# Patient Record
Sex: Female | Born: 1950 | Race: White | Hispanic: No | Marital: Married | Smoking: Former smoker
Health system: Southern US, Community
[De-identification: ages and names within clinical notes are randomized; demographics above are authoritative.]

## PROBLEM LIST (undated history)

## (undated) DIAGNOSIS — Z9889 Other specified postprocedural states: Secondary | ICD-10-CM

## (undated) DIAGNOSIS — K922 Gastrointestinal hemorrhage, unspecified: Secondary | ICD-10-CM

## (undated) DIAGNOSIS — R112 Nausea with vomiting, unspecified: Secondary | ICD-10-CM

## (undated) DIAGNOSIS — I499 Cardiac arrhythmia, unspecified: Secondary | ICD-10-CM

## (undated) DIAGNOSIS — I493 Ventricular premature depolarization: Secondary | ICD-10-CM

## (undated) DIAGNOSIS — I421 Obstructive hypertrophic cardiomyopathy: Secondary | ICD-10-CM

## (undated) DIAGNOSIS — E669 Obesity, unspecified: Secondary | ICD-10-CM

## (undated) DIAGNOSIS — M1990 Unspecified osteoarthritis, unspecified site: Secondary | ICD-10-CM

## (undated) DIAGNOSIS — I10 Essential (primary) hypertension: Secondary | ICD-10-CM

## (undated) DIAGNOSIS — T8859XA Other complications of anesthesia, initial encounter: Secondary | ICD-10-CM

## (undated) DIAGNOSIS — K219 Gastro-esophageal reflux disease without esophagitis: Secondary | ICD-10-CM

## (undated) DIAGNOSIS — M47816 Spondylosis without myelopathy or radiculopathy, lumbar region: Secondary | ICD-10-CM

## (undated) DIAGNOSIS — Q278 Other specified congenital malformations of peripheral vascular system: Secondary | ICD-10-CM

## (undated) HISTORY — DX: Unspecified osteoarthritis, unspecified site: M19.90

## (undated) HISTORY — DX: Cardiac arrhythmia, unspecified: I49.9

## (undated) HISTORY — DX: Obstructive hypertrophic cardiomyopathy: I42.1

## (undated) HISTORY — PX: CERVICAL DISCECTOMY: SHX98

## (undated) HISTORY — PX: THORACIC FUSION: SHX1062

## (undated) HISTORY — PX: BACK SURGERY: SHX140

## (undated) HISTORY — PX: CHOLECYSTECTOMY: SHX55

## (undated) HISTORY — DX: Obesity, unspecified: E66.9

## (undated) HISTORY — DX: Ventricular premature depolarization: I49.3

## (undated) HISTORY — DX: Essential (primary) hypertension: I10

## (undated) HISTORY — PX: CERVICAL FUSION: SHX112

## (undated) HISTORY — DX: Spondylosis without myelopathy or radiculopathy, lumbar region: M47.816

## (undated) HISTORY — PX: ABDOMINAL HYSTERECTOMY: SHX81

## (undated) HISTORY — PX: MENISCECTOMY: SHX123

## (undated) HISTORY — PX: LUMBAR DISC SURGERY: SHX700

## (undated) HISTORY — PX: THORACIC DISCECTOMY: SHX100

---

## 2017-05-15 ENCOUNTER — Other Ambulatory Visit: Payer: Self-pay | Admitting: Internal Medicine

## 2017-05-15 ENCOUNTER — Encounter: Payer: Self-pay | Admitting: Gastroenterology

## 2017-05-15 DIAGNOSIS — Z1231 Encounter for screening mammogram for malignant neoplasm of breast: Secondary | ICD-10-CM

## 2017-05-24 ENCOUNTER — Ambulatory Visit
Admission: RE | Admit: 2017-05-24 | Discharge: 2017-05-24 | Disposition: A | Discharge status: Home / Self Care | Payer: Medicare Other | Source: Ambulatory Visit | Attending: Internal Medicine | Admitting: Internal Medicine

## 2017-05-24 DIAGNOSIS — Z1231 Encounter for screening mammogram for malignant neoplasm of breast: Secondary | ICD-10-CM

## 2017-07-04 ENCOUNTER — Ambulatory Visit: Payer: Self-pay | Admitting: Gastroenterology

## 2017-08-27 ENCOUNTER — Ambulatory Visit: Payer: Medicare Other | Admitting: Gastroenterology

## 2017-12-31 ENCOUNTER — Encounter: Payer: Self-pay | Admitting: Emergency Medicine

## 2017-12-31 ENCOUNTER — Ambulatory Visit (INDEPENDENT_AMBULATORY_CARE_PROVIDER_SITE_OTHER)
Admission: RE | Admit: 2017-12-31 | Discharge: 2017-12-31 | Disposition: A | Discharge status: Home / Self Care | Payer: Medicare Other | Source: Ambulatory Visit | Attending: Emergency Medicine | Admitting: Emergency Medicine

## 2017-12-31 ENCOUNTER — Ambulatory Visit (INDEPENDENT_AMBULATORY_CARE_PROVIDER_SITE_OTHER): Payer: Medicare Other | Admitting: Emergency Medicine

## 2017-12-31 VITALS — BP 114/76 | HR 86 | Ht 66.0 in | Wt 249.0 lb

## 2017-12-31 DIAGNOSIS — R490 Dysphonia: Secondary | ICD-10-CM

## 2017-12-31 DIAGNOSIS — R05 Cough: Secondary | ICD-10-CM | POA: Diagnosis not present

## 2017-12-31 DIAGNOSIS — R059 Cough, unspecified: Secondary | ICD-10-CM

## 2017-12-31 MED ORDER — LORATADINE 10 MG PO TABS: 10.0000 mg | ORAL_TABLET | Freq: Every day | ORAL | 5 refills | Status: DC

## 2017-12-31 MED ORDER — FLUTICASONE PROPIONATE 50 MCG/ACT NA SUSP: 2.0000 | Freq: Every day | NASAL | 5 refills | Status: DC

## 2017-12-31 NOTE — Assessment & Plan Note (Signed)
She hears noise when she breathes especially with expiration, it is a crackling or wheeze she feels localized to her throat.  She feels a globus sensation.  This sounds like upper airway irritation.  She denies any GERD but she does have chronic rhinitis.  We will try to treat this aggressively.  We talked about avoiding throat clearing and other things away irritate her throat.  We will perform a chest x-ray today and start therapy as below.  If her symptoms continue then we will discuss possible visualization of her posterior pharynx and her airways.

## 2017-12-31 NOTE — Patient Instructions (Signed)
We will perform a CXR today.  Please start taking fluticasone nasal spray, 2 sprays each nostril once a day.  Try to avoid taking this medication right before laying down for bed because we want to avoid having it drained down to your throat. Please start loratadine 10 mg daily until our next visit. Try your best to avoid throat clearing.  Try using a sugar-free candy to keep in your mouth.  When you have the urge to clear your throat just swallow instead. Avoid methylated cough drops. Follow with Dr. Delton CoombesByrum in about 1 month.  At that time we will assess your symptoms and decide whether we need to expand the workup further.

## 2017-12-31 NOTE — Progress Notes (Signed)
Subjective:    Patient ID: Jennifer Baxter, female    DOB: [birth year], 67 y.o.   MRN: [id number]  HPI 67 year old former smoker (10 pk-yrs) with a history of hypertension, ?? hypertrophic obstructive cardiomyopathy (soft call on TTE), obesity, PVCs, prior GI bleeding, spinal surgeries.  She presents today for evaluation of some expiratory noise, ? "Crackling", that she started to notice a few weeks ago. Seems to localize to her upper throat. She has nasal drainage and some congestion. Feels the need to clear throat, globus sensation. Possibly some dyspnea. Has gained about 100 lbs since a back sgy 10+ yrs ago.    Review of Systems  Constitutional: Negative for fever and unexpected weight change.  HENT: Negative for congestion, dental problem, ear pain, nosebleeds, postnasal drip, rhinorrhea, sinus pressure, sneezing, sore throat and trouble swallowing.   Eyes: Negative for redness and itching.  Respiratory: Positive for cough, shortness of breath and wheezing. Negative for chest tightness.   Cardiovascular: Negative for palpitations and leg swelling.  Gastrointestinal: Negative for nausea and vomiting.  Genitourinary: Negative for dysuria.  Musculoskeletal: Negative for joint swelling.  Skin: Negative for rash.  Neurological: Negative for headaches.  Hematological: Does not bruise/bleed easily.  Psychiatric/Behavioral: Negative for dysphoric mood. The patient is not nervous/anxious.    Past Medical History:  Diagnosis Date  . Arrhythmia   . Arthritis   . Degenerative joint disease (DJD) of lumbar spine   . DJD (degenerative joint disease)   . HOCM (hypertrophic obstructive cardiomyopathy) (HCC)   . Hypertension   . Obesity   . PVC's (premature ventricular contractions)      Family History  Problem Relation Age of Onset  . Arthritis Mother   . Diabetes Mellitus I Mother   . Hypertension Mother   . Arthritis Father   . Hypertension Father   . Arthritis Sister   . Hypertension  Sister   . Stroke Sister   . Alcoholism Brother   . Colon cancer Brother   . Hypertension Brother   . Diabetes Mellitus I Maternal Grandmother   . Heart disease Paternal Grandmother   . Hypertension Paternal Grandfather   . Stroke Paternal Grandfather   . Breast cancer Neg Hx      Social History   Socioeconomic History  . Marital status: Married    Spouse name: Not on file  . Number of children: Not on file  . Years of education: Not on file  . Highest education level: Not on file  Occupational History  . Not on file  Social Needs  . Financial resource strain: Not on file  . Food insecurity:    Worry: Not on file    Inability: Not on file  . Transportation needs:    Medical: Not on file    Non-medical: Not on file  Tobacco Use  . Smoking status: Never Smoker  . Smokeless tobacco: Never Used  Substance and Sexual Activity  . Alcohol use: No  . Drug use: No  . Sexual activity: Not on file  Lifestyle  . Physical activity:    Days per week: Not on file    Minutes per session: Not on file  . Stress: Not on file  Relationships  . Social connections:    Talks on phone: Not on file    Gets together: Not on file    Attends religious service: Not on file    Active member of club or organization: Not on file    Attends meetings of clubs  or organizations: Not on file    Relationship status: Not on file  . Intimate partner violence:    Fear of current or ex partner: Not on file    Emotionally abused: Not on file    Physically abused: Not on file    Forced sexual activity: Not on file  Other Topics Concern  . Not on file  Social History Narrative  . 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  Has lived in   Allergies  Allergen Reactions  . Codeine Hives, Itching and Rash  . Morphine Hives, Itching and Other (See Comments)    Questionable Arm swelled      Outpatient Medications Prior to Visit    Medication Sig Dispense Refill  . amLODipine (NORVASC) 5 MG tablet Take 1 tablet by mouth daily.    Marland Kitchen. aspirin 81 MG tablet Take 1 tablet by mouth daily.    . metoprolol tartrate (LOPRESSOR) 100 MG tablet Take 1 tablet by mouth 2 (two) times daily.     No facility-administered medications prior to visit.         Objective:   Physical Exam Vitals:   12/31/17 1518  BP: 114/76  Pulse: 86  SpO2: 97%  Weight: 249 lb (112.9 kg)  Height: 5\' 6"  (1.676 m)   Gen: Pleasant, well-nourished, in no distress,  normal affect  ENT: No lesions,  mouth clear,  oropharynx clear, no postnasal drip  Neck: No JVD, some intermittent soft stridor  Lungs: No use of accessory muscles, clear without rales or rhonchi  Cardiovascular: RRR, heart sounds normal, no murmur or gallops, no peripheral edema  Musculoskeletal: No deformities, no cyanosis or clubbing  Neuro: alert, non focal  Skin: Warm, no lesions or rash     Assessment & Plan:  Hoarseness She hears noise when she breathes especially with expiration, it is a crackling or wheeze she feels localized to her throat.  She feels a globus sensation.  This sounds like upper airway irritation.  She denies any GERD but she does have chronic rhinitis.  We will try to treat this aggressively.  We talked about avoiding throat clearing and other things away irritate her throat.  We will perform a chest x-ray today and start therapy as below.  If her symptoms continue then we will discuss possible visualization of her posterior pharynx and her airways.  Levy Pupaobert Demtrius Rounds, MD, PhD 12/31/2017, 3:49 PM [facility] [phone] or if no answer [phone]

## 2018-02-03 ENCOUNTER — Ambulatory Visit: Payer: Medicare Other | Admitting: Emergency Medicine

## 2018-06-30 ENCOUNTER — Other Ambulatory Visit: Payer: Self-pay | Admitting: Orthopedic Surgery

## 2018-06-30 DIAGNOSIS — M542 Cervicalgia: Secondary | ICD-10-CM

## 2018-07-08 ENCOUNTER — Ambulatory Visit
Admission: RE | Admit: 2018-07-08 | Discharge: 2018-07-08 | Disposition: A | Discharge status: Home / Self Care | Payer: Medicare Other | Source: Ambulatory Visit | Attending: Orthopedic Surgery | Admitting: Orthopedic Surgery

## 2018-07-08 DIAGNOSIS — M542 Cervicalgia: Secondary | ICD-10-CM

## 2018-07-14 ENCOUNTER — Other Ambulatory Visit: Payer: Self-pay | Admitting: Otolaryngology

## 2018-07-14 DIAGNOSIS — IMO0001 Reserved for inherently not codable concepts without codable children: Secondary | ICD-10-CM

## 2018-07-14 DIAGNOSIS — H9041 Sensorineural hearing loss, unilateral, right ear, with unrestricted hearing on the contralateral side: Secondary | ICD-10-CM

## 2018-07-20 ENCOUNTER — Ambulatory Visit
Admission: RE | Admit: 2018-07-20 | Discharge: 2018-07-20 | Disposition: A | Discharge status: Home / Self Care | Payer: Medicare Other | Source: Ambulatory Visit | Attending: Otolaryngology | Admitting: Otolaryngology

## 2018-07-20 DIAGNOSIS — IMO0001 Reserved for inherently not codable concepts without codable children: Secondary | ICD-10-CM

## 2018-07-20 DIAGNOSIS — H9041 Sensorineural hearing loss, unilateral, right ear, with unrestricted hearing on the contralateral side: Secondary | ICD-10-CM

## 2018-07-20 MED ORDER — GADOBENATE DIMEGLUMINE 529 MG/ML IV SOLN
15.0000 mL | Freq: Once | INTRAVENOUS | Status: AC | PRN
  Administered 2018-07-20: 15 mL via INTRAVENOUS

## 2019-05-24 ENCOUNTER — Other Ambulatory Visit: Payer: Self-pay

## 2019-05-24 ENCOUNTER — Emergency Department (HOSPITAL_COMMUNITY): Payer: Medicare Other

## 2019-05-24 ENCOUNTER — Emergency Department (HOSPITAL_COMMUNITY)
Admission: EM | Admit: 2019-05-24 | Discharge: 2019-05-24 | Disposition: A | Discharge status: Home / Self Care | Payer: Medicare Other | Attending: Emergency Medicine | Admitting: Emergency Medicine

## 2019-05-24 ENCOUNTER — Encounter (HOSPITAL_COMMUNITY): Payer: Self-pay | Admitting: Emergency Medicine

## 2019-05-24 DIAGNOSIS — Z79899 Other long term (current) drug therapy: Secondary | ICD-10-CM | POA: Diagnosis not present

## 2019-05-24 DIAGNOSIS — I493 Ventricular premature depolarization: Secondary | ICD-10-CM

## 2019-05-24 DIAGNOSIS — R000 Tachycardia, unspecified: Secondary | ICD-10-CM | POA: Diagnosis present

## 2019-05-24 DIAGNOSIS — E876 Hypokalemia: Secondary | ICD-10-CM | POA: Diagnosis not present

## 2019-05-24 DIAGNOSIS — R002 Palpitations: Secondary | ICD-10-CM

## 2019-05-24 DIAGNOSIS — I10 Essential (primary) hypertension: Secondary | ICD-10-CM | POA: Diagnosis not present

## 2019-05-24 DIAGNOSIS — Z7982 Long term (current) use of aspirin: Secondary | ICD-10-CM | POA: Diagnosis not present

## 2019-05-24 LAB — BASIC METABOLIC PANEL
Anion gap: 17 — ABNORMAL HIGH (ref 5–15)
BUN: 20 mg/dL (ref 8–23)
CO2: 20 mmol/L — ABNORMAL LOW (ref 22–32)
Calcium: 9.8 mg/dL (ref 8.9–10.3)
Chloride: 101 mmol/L (ref 98–111)
Creatinine, Ser: 1.1 mg/dL — ABNORMAL HIGH (ref 0.44–1.00)
GFR calc Af Amer: 60 mL/min — ABNORMAL LOW (ref >60)
GFR calc non Af Amer: 52 mL/min — ABNORMAL LOW (ref >60)
Glucose, Bld: 118 mg/dL — ABNORMAL HIGH (ref 70–99)
Potassium: 2.8 mmol/L — ABNORMAL LOW (ref 3.5–5.1)
Sodium: 138 mmol/L (ref 135–145)

## 2019-05-24 LAB — TROPONIN I (HIGH SENSITIVITY)
Troponin I (High Sensitivity): 5 ng/L (ref <18)
Troponin I (High Sensitivity): 5 ng/L (ref <18)

## 2019-05-24 LAB — MAGNESIUM: Magnesium: 2.2 mg/dL (ref 1.7–2.4)

## 2019-05-24 LAB — CBC
HCT: 41.5 % (ref 36.0–46.0)
Hemoglobin: 14.5 g/dL (ref 12.0–15.0)
MCH: 31.5 pg (ref 26.0–34.0)
MCHC: 34.9 g/dL (ref 30.0–36.0)
MCV: 90.2 fL (ref 80.0–100.0)
Platelets: 263 10*3/uL (ref 150–400)
RBC: 4.6 MIL/uL (ref 3.87–5.11)
RDW: 11.9 % (ref 11.5–15.5)
WBC: 11.7 10*3/uL — ABNORMAL HIGH (ref 4.0–10.5)
nRBC: 0 % (ref 0.0–0.2)

## 2019-05-24 MED ORDER — ACETAMINOPHEN 500 MG PO TABS
1000.0000 mg | ORAL_TABLET | Freq: Once | ORAL | Status: AC
  Administered 2019-05-24: 1000 mg via ORAL
  Filled 2019-05-24: qty 2

## 2019-05-24 MED ORDER — POTASSIUM CHLORIDE CRYS ER 20 MEQ PO TBCR
40.0000 meq | EXTENDED_RELEASE_TABLET | Freq: Once | ORAL | Status: AC
  Administered 2019-05-24: 40 meq via ORAL
  Filled 2019-05-24: qty 2

## 2019-05-24 MED ORDER — SODIUM CHLORIDE 0.9% FLUSH: 3.0000 mL | Freq: Once | INTRAVENOUS | Status: DC

## 2019-05-24 MED ORDER — POTASSIUM CHLORIDE 10 MEQ/100ML IV SOLN
10.0000 meq | Freq: Once | INTRAVENOUS | Status: AC
  Administered 2019-05-24: 09:00:00 10 meq via INTRAVENOUS
  Filled 2019-05-24: qty 100

## 2019-05-24 MED ORDER — MAGNESIUM SULFATE 2 GM/50ML IV SOLN
2.0000 g | Freq: Once | INTRAVENOUS | Status: AC
  Administered 2019-05-24: 2 g via INTRAVENOUS
  Filled 2019-05-24: qty 50

## 2019-05-24 MED ORDER — SODIUM CHLORIDE 0.9 % IV BOLUS
1000.0000 mL | Freq: Once | INTRAVENOUS | Status: AC
  Administered 2019-05-24: 1000 mL via INTRAVENOUS

## 2019-05-24 NOTE — ED Triage Notes (Signed)
Patient from home with tachycardia with multiple PVC's.  Her HR is around 90-100's.  Any exertion her HR shoots up to 130's.  She has had a changed her meds from Metoprolol to Verapamil.  She is dizzy with standing and walking.

## 2019-05-24 NOTE — ED Notes (Signed)
Attempted to ambulate pt. Was only able to get her to standing position. Pt denied dizziness but said she felt "like something wasn't right" Pt swayed a bit upon standing. HR went from 80 bpm sitting, to 125 bpm standing.

## 2019-05-24 NOTE — ED Provider Notes (Signed)
[facility] Provider Note   CSN: [id number] Arrival date & time: 05/24/19  0411     History   Chief Complaint Chief Complaint  Patient presents with   Tachycardia    HPI Rufina Falcoileen Bey is a 68 y.o. female.     HPI  This is a 68 year old female with a history of hypertension, frequent PVCs, hypertrophic obstructive cardiomyopathy who presents with tachycardia.  Patient reports she has a longstanding history of frequent and symptomatic PVCs.  She was on metoprolol for a long period of time but then became symptomatic again.  She saw her cardiologist in [place] on Friday.  She was taken off amlodipine and metoprolol and was placed on verapamil extended relief.  She states that last night she had episodes of profound tachycardia and frequent PVCs.  Worsened with certain position changes.  She began to feel nauseated and some shortness of breath.  No chest pain.  No recent illnesses or fevers.  Currently she reports that she just feels like she is throwing PVCs frequently.  Denies any recent other changes in medications or diet.  Past Medical History:  Diagnosis Date   Arrhythmia    Arthritis    Degenerative joint disease (DJD) of lumbar spine    DJD (degenerative joint disease)    HOCM (hypertrophic obstructive cardiomyopathy) (HCC)    Hypertension    Obesity    PVC's (premature ventricular contractions)     Patient Active Problem List   Diagnosis Date Noted   Hoarseness 12/31/2017    Past Surgical History:  Procedure Laterality Date   ABDOMINAL HYSTERECTOMY     CERVICAL DISCECTOMY     CERVICAL FUSION     CHOLECYSTECTOMY     LUMBAR DISC SURGERY     MENISCECTOMY     THORACIC DISCECTOMY       OB History   No obstetric history on file.      Home Medications    Prior to Admission medications   Medication Sig Start Date End Date Taking? Authorizing Provider  amLODipine (NORVASC) 5 MG tablet Take 1 tablet by  mouth daily. 11/07/17   [provider]  aspirin 81 MG tablet Take 1 tablet by mouth daily.    [provider]  fluticasone (FLONASE) 50 MCG/ACT nasal spray Place 2 sprays into both nostrils daily. 12/31/17   Leslye PeerByrum, Robert S, MD  loratadine (CLARITIN) 10 MG tablet Take 1 tablet (10 mg total) by mouth daily. 12/31/17   Leslye PeerByrum, Robert S, MD  metoprolol tartrate (LOPRESSOR) 100 MG tablet Take 1 tablet by mouth 2 (two) times daily. 04/26/16   [provider]    Family History Family History  Problem Relation Age of Onset   Arthritis Mother    Diabetes Mellitus I Mother    Hypertension Mother    Arthritis Father    Hypertension Father    Arthritis Sister    Hypertension Sister    Stroke Sister    Alcoholism Brother    Colon cancer Brother    Hypertension Brother    Diabetes Mellitus I Maternal Grandmother    Heart disease Paternal Grandmother    Hypertension Paternal Grandfather    Stroke Paternal Grandfather    Breast cancer Neg Hx     Social History Social History   Tobacco Use   Smoking status: Never Smoker   Smokeless tobacco: Never Used  Substance Use Topics   Alcohol use: No   Drug use: No     Allergies  Codeine and Morphine   Review of Systems Review of Systems  Constitutional: Negative for fever.  Respiratory: Negative for shortness of breath.   Cardiovascular: Positive for palpitations. Negative for chest pain and leg swelling.  Gastrointestinal: Positive for nausea. Negative for abdominal pain and vomiting.  Genitourinary: Negative for dysuria.  All other systems reviewed and are negative.    Physical Exam Updated Vital Signs BP (!) 131/96    Pulse 96    Temp 98.6 F (37 C) (Oral)    Resp (!) 22    SpO2 100%   Physical Exam Vitals signs and nursing note reviewed.  Constitutional:      Appearance: She is well-developed.  HENT:     Head: Normocephalic and atraumatic.  Eyes:     Pupils: Pupils are equal,  round, and reactive to light.  Neck:     Musculoskeletal: Neck supple.  Cardiovascular:     Rate and Rhythm: Regular rhythm. Tachycardia present.     Pulses: Normal pulses.     Heart sounds: Normal heart sounds.     Comments: Frequent PVCs Pulmonary:     Effort: Pulmonary effort is normal. No respiratory distress.     Breath sounds: No wheezing.  Abdominal:     General: Bowel sounds are normal.     Palpations: Abdomen is soft.     Tenderness: There is no abdominal tenderness.  Musculoskeletal:     Right lower leg: No edema.     Left lower leg: No edema.  Skin:    General: Skin is warm and dry.  Neurological:     Mental Status: She is alert and oriented to person, place, and time.  Psychiatric:        Mood and Affect: Mood normal.     Comments: Anxious appearing but nontoxic      ED Treatments / Results  Labs (all labs ordered are listed, but only abnormal results are displayed) Labs Reviewed  BASIC METABOLIC PANEL - Abnormal; Notable for the following components:      Result Value   Potassium 2.8 (*)    CO2 20 (*)    Glucose, Bld 118 (*)    Creatinine, Ser 1.10 (*)    GFR calc non Af Amer 52 (*)    GFR calc Af Amer 60 (*)    Anion gap 17 (*)    All other components within normal limits  CBC - Abnormal; Notable for the following components:   WBC 11.7 (*)    All other components within normal limits  MAGNESIUM  TROPONIN I (HIGH SENSITIVITY)  TROPONIN I (HIGH SENSITIVITY)    EKG EKG Interpretation  Date/Time:  Sunday May 24 2019 04:21:39 EDT Ventricular Rate:  114 PR Interval:  164 QRS Duration: 86 QT Interval:  344 QTC Calculation: 474 R Axis:   18 Text Interpretation:  Sinus tachycardia with Fusion complexes Nonspecific ST abnormality Abnormal ECG Confirmed by Ross Marcus ([id number]) on 05/24/2019 6:02:50 AM   Radiology Dg Chest 2 View  Result Date: 05/24/2019 CLINICAL DATA:  Tachycardia and PVCs. Chest pain. EXAM: CHEST - 2 VIEW COMPARISON:   12/31/2017 FINDINGS: Midline trachea. Normal heart size and mediastinal contours. No pleural effusion or pneumothorax. Clear lungs. Cholecystectomy. IMPRESSION: No acute cardiopulmonary disease. Electronically Signed   By: Jeronimo Greaves M.D.   On: 05/24/2019 05:04    Procedures Procedures (including critical care time)  Medications Ordered in ED Medications  sodium chloride flush (NS) 0.9 % injection 3 mL (has no administration in  time range)  sodium chloride 0.9 % bolus 1,000 mL (has no administration in time range)  potassium chloride 10 mEq in 100 mL IVPB (has no administration in time range)  magnesium sulfate IVPB 2 g 50 mL (has no administration in time range)  potassium chloride SA (K-DUR) CR tablet 40 mEq (40 mEq Oral Given 05/24/19 6606)     Initial Impression / Assessment and Plan / ED Course  I have reviewed the triage vital signs and the nursing notes.  Pertinent labs & imaging results that were available during my care of the patient were reviewed by me and considered in my medical decision making (see chart for details).        Patient presents with palpitations and PVC.  Recent medication changes from metoprolol and amlodipine to verapamil.  She is nontoxic-appearing.  Initially tachycardic.  EKG shows tachycardia with frequent PVCs.  Work-up initiated.  She is noted to be hypokalemic to 2.8.  Magnesium added to work-up.  Presumed hypo-mag and she will be supplemented with magnesium as well.  Troponin is negative.  No ischemic changes on EKG.  She reports some dizziness.  Patient was given fluids as well for presumed mild dehydration which is likely contributing.  Will sign out to oncoming provider.  Final Clinical Impressions(s) / ED Diagnoses   Final diagnoses:  Palpitations  PVC (premature ventricular contraction)  Hypokalemia    ED Discharge Orders    None       Loriel Diehl, Barbette Hair, MD 05/24/19 [phone]

## 2019-05-24 NOTE — Discharge Instructions (Signed)
Go back to taking your metoprolol as previously prescribed.  The cardiologist is trying to get you an appointment sometime next week.  If you have not heard anything by Monday or Tuesday he can give the office a call and ask.  Please return to the emergency department for worsening symptoms or concern for feeling like you may pass out or if you pass out.

## 2019-05-24 NOTE — ED Provider Notes (Signed)
68 yo F with a cc of symptomatic palpitations.  Going on for the past few days.  She was seen in her cardiologist office on Friday and had a medication change.  Since then she feels that her symptoms are gotten worse.  She was having some palpitations and tachycardia on arrival.  I received the patient in signout from Dr. Dina Rich.  She was found to have hypokalemia and the plan was to give a bolus of IV fluids potassium and magnesium and then reassess.  Patient unfortunately was a difficult IV stick and so I placed an ultrasound-guided IV.  Procedure note: Ultrasound Guided Peripheral IV Ultrasound guided peripheral 1.88 inch angiocath IV placement performed by me. Indications: Nursing unable to place IV. Details: The antecubital fossa and upper arm were evaluated with a multifrequency linear probe. Patent brachial veins were noted. 1 attempt was made to cannulate a vein under realtime US guidance with successful cannulation of the vein and catheter placement. There is return of non-pulsatile dark red blood. The patient tolerated the procedure well without complications. Images archived electronically.  CPT codes: [phone] and [phone]   The patient's PVCs have resolved with fluids potassium and magnesium.  She still feels very weak when she gets up and feels that she is having palpitations when she does very minimal exertion.  She has been on 100 mg twice daily of metoprolol.  Was taken off abruptly on Friday and switch to 5 mg of verapamil.  I will discuss the case with her cardiologist here.  I discussed case with Dr. Rayann Heman, cardiology.  He recommended switching her back to her metoprolol.  He will try and schedule her follow-up with the electrophysiology clinic this week.   Deno Etienne, DO 05/24/19 1217

## 2019-06-03 ENCOUNTER — Other Ambulatory Visit: Payer: Self-pay

## 2019-06-03 ENCOUNTER — Encounter: Payer: Self-pay | Admitting: Cardiology

## 2019-06-03 ENCOUNTER — Ambulatory Visit (INDEPENDENT_AMBULATORY_CARE_PROVIDER_SITE_OTHER): Payer: Medicare Other | Admitting: Cardiology

## 2019-06-03 VITALS — BP 118/82 | HR 84 | Ht 66.0 in | Wt 240.4 lb

## 2019-06-03 DIAGNOSIS — I471 Supraventricular tachycardia: Secondary | ICD-10-CM | POA: Diagnosis not present

## 2019-06-03 DIAGNOSIS — I493 Ventricular premature depolarization: Secondary | ICD-10-CM | POA: Diagnosis not present

## 2019-06-03 MED ORDER — DILTIAZEM HCL ER COATED BEADS 120 MG PO CP24: 120.0000 mg | ORAL_CAPSULE | Freq: Every day | ORAL | 3 refills | Status: DC

## 2019-06-03 NOTE — Progress Notes (Signed)
Electrophysiology Office Note   Date:  06/03/2019   ID:  Jennifer Baxter, DOB 07-20-1951, MRN [id number]  PCP:  Haywood Pao, MD  Cardiologist:   Primary Electrophysiologist:  Devonna Oboyle Meredith Leeds, MD    Chief Complaint: palpitations   History of Present Illness: Jennifer Baxter is a 68 y.o. female who is being seen today for the evaluation of PVCs at the request of Tisovec, Fransico Him, MD. Presenting today for electrophysiology evaluation.  She has a history of hypertension and PVCs who presents for evaluation of PVCs.  She has previously been on metoprolol.  She saw cardiologist in [place] and was taken off of amlodipine and metoprolol and started on verapamil.  She presented to the emergency room a few days later with profound episodes of tachycardia and frequent PVCs.  She also complained of nausea and shortness of breath.  She had no chest pain.  She has a history of LVH and exaggerated exercise induced tachycardia.  Her tachycardia was initially attributed to anemia after she was found to have a GI bleed.  Anemia was corrected and she continued to have exaggerated heart rate response to exercise.  Heart rates have increased into the 140s to 150s range.  Since that time, she was switched back to her Toprol-XL and amlodipine.  Her blood pressures were low and thus she stopped amlodipine.  Toprol-XL was continued.  She is felt much improved since being on her Toprol-XL, though after about 8 hours of taking the medication, symptoms do return but more mildly.  Today, she denies symptoms of palpitations, chest pain, shortness of breath, orthopnea, PND, lower extremity edema, claudication, dizziness, presyncope, syncope, bleeding, or neurologic sequela. The patient is tolerating medications without difficulties.    Past Medical History:  Diagnosis Date  . Arrhythmia   . Arthritis   . Degenerative joint disease (DJD) of lumbar spine   . DJD (degenerative joint disease)   . HOCM  (hypertrophic obstructive cardiomyopathy) ([place])   . Hypertension   . Obesity   . PVC's (premature ventricular contractions)    Past Surgical History:  Procedure Laterality Date  . ABDOMINAL HYSTERECTOMY    . CERVICAL DISCECTOMY    . CERVICAL FUSION    . CHOLECYSTECTOMY    . LUMBAR DISC SURGERY    . MENISCECTOMY    . THORACIC DISCECTOMY       Current Outpatient Medications  Medication Sig Dispense Refill  . amLODipine (NORVASC) 5 MG tablet Take 5 mg by mouth daily.    . chlorthalidone (HYGROTON) 25 MG tablet Take 25 mg by mouth every 3 (three) days.    . metoprolol succinate (TOPROL-XL) 100 MG 24 hr tablet Take 100 mg by mouth 2 (two) times daily. Take with or immediately following a meal.    . naproxen (NAPROSYN) 500 MG tablet Take 500 mg by mouth as needed.    . diltiazem (CARDIZEM CD) 120 MG 24 hr capsule Take 1 capsule (120 mg total) by mouth daily. 30 capsule 3   No current facility-administered medications for this visit.     Allergies:   Codeine and Morphine   Social History:  The patient  reports that she has never smoked. She has never used smokeless tobacco. She reports that she does not drink alcohol or use drugs.   Family History:  The patient's family history includes Alcoholism in her brother; Arthritis in her father, mother, and sister; Colon cancer in her brother; Diabetes Mellitus I in her maternal grandmother and mother; Heart  disease in her paternal grandmother; Hypertension in her brother, father, mother, paternal grandfather, and sister; Stroke in her paternal grandfather and sister.    ROS:  Please see the history of present illness.   Otherwise, review of systems is positive for none.   All other systems are reviewed and negative.    PHYSICAL EXAM: VS:  BP 118/82   Pulse 84   Ht 5\' 6"  (1.676 m)   Wt 240 lb 6.4 oz (109 kg)   SpO2 98%   BMI 38.80 kg/m  , BMI Body mass index is 38.8 kg/m. GEN: Well nourished, well developed, in no acute distress   HEENT: normal  Neck: no JVD, carotid bruits, or masses Cardiac: RRR; no murmurs, rubs, or gallops,no edema  Respiratory:  clear to auscultation bilaterally, normal work of breathing GI: soft, nontender, nondistended, + BS MS: no deformity or atrophy  Skin: warm and dry Neuro:  Strength and sensation are intact Psych: euthymic mood, full affect  EKG:  EKG is ordered today. Personal review of the ekg ordered shows SR, rate 84, low voltage  Recent Labs: 05/24/2019: BUN 20; Creatinine, Ser 1.10; Hemoglobin 14.5; Magnesium 2.2; Platelets 263; Potassium 2.8; Sodium 138    Lipid Panel  No results found for: CHOL, TRIG, HDL, CHOLHDL, VLDL, LDLCALC, LDLDIRECT   Wt Readings from Last 3 Encounters:  06/03/19 240 lb 6.4 oz (109 kg)  12/31/17 249 lb (112.9 kg)      Other studies Reviewed: Additional studies/ records that were reviewed today include: CMRI 2015 Cardiac MRI performed with and without contrast on a 3.0 T MRI system to evaluate myocardial morphology, function, and viability in a patient with concerns for infiltrative cardiomyopathy vs asymmetric septal hypertrophy  1.The left ventricle is normal in size and wall thickness.Although the septum is mildly sigmoid shaped, wall thickness measures 1.1 cm.Regional and global systolic function are normal.The LVEF is calculated at >60%.  2.The right ventricle is normal in size and systolic function.  3.The atria are normal in size.  4.The mitral valve leaflets are thickened with normal opening.There is no significant mitral stenosis or regurgitation.There is mild tricuspid regurgitation.  5.The aortic valve is tricuspid with normal opening.There is mild leaflet thickening without significant aortic stenosis or regurgitation.  6.Delayed enhancement imaging for viability is normal.There is no evidence of myocardial infarction, scarring, or infiltration.  ASSESSMENT AND PLAN:  1.  PVCs: She brings in  recordings that show PVCs and AIVR.  She is in sinus rhythm during this time.  She is currently on metoprolol, but feels that as the metoprolol is wearing off nearing the next dose, her burden increases.  We Jennifer Baxter thus start her on 120 of diltiazem as well as Toprol-XL 100 mg to see if this Jennifer Baxter help with her symptoms.    Current medicines are reviewed at length with the patient today.   The patient does not have concerns regarding her medicines.  The following changes were made today:  none  Labs/ tests ordered today include:  Orders Placed This Encounter  Procedures  . EKG 12-Lead     Disposition:   FU with Jennifer Baxter 3 months  Signed, Thierry Dobosz Jennifer LoaMartin Ethleen Lormand, MD  06/03/2019 3:41 PM     [facility] [address] [place] [address] [phone] (office) [phone] (fax)

## 2019-06-03 NOTE — Patient Instructions (Addendum)
Medication Instructions:  Your physician has recommended you make the following change in your medication:  1. START Diltiazem 120 mg once a day  * If you need a refill on your cardiac medications before your next appointment, please call your pharmacy.   Labwork: None ordered  Testing/Procedures: None ordered  Follow-Up: Your physician recommends that you schedule a follow-up appointment in: 3 months with Dr. Curt Bears.   Thank you for choosing [facility]!!   Trinidad Curet, RN [phone]  Any Other Special Instructions Will Be Listed Below (If Applicable).    Diltiazem tablets What is this medicine? DILTIAZEM (dil TYE a zem) is a calcium-channel blocker. It affects the amount of calcium found in your heart and muscle cells. This relaxes your blood vessels, which can reduce the amount of work the heart has to do. This medicine is used to treat chest pain caused by angina. This medicine may be used for other purposes; ask your health care provider or pharmacist if you have questions. COMMON BRAND NAME(S): Cardizem What should I tell my health care provider before I take this medicine? They need to know if you have any of these conditions:  heart problems, low blood pressure, irregular heartbeat  liver disease  previous heart attack  an unusual or allergic reaction to diltiazem, other medicines, foods, dyes, or preservatives  pregnant or trying to get pregnant  breast-feeding How should I use this medicine? Take this medicine by mouth with a glass of water. Follow the directions on the prescription label. Do not cut, crush or chew this medicine. This medicine is usually taken before meals and at bedtime. Take your doses at regular intervals. Do not take your medicine more often then directed. Do not stop taking except on the advice of your doctor or health care professional. Talk to your pediatrician regarding the use of this medicine in children. Special care may be  needed. Overdosage: If you think you have taken too much of this medicine contact a poison control center or emergency room at once. NOTE: This medicine is only for you. Do not share this medicine with others. What if I miss a dose? If you miss a dose, take it as soon as you can. If it is almost time for your next dose, take only that dose. Do not take double or extra doses. What may interact with this medicine? Do not take this medicine with any of the following:  cisapride  hawthorn  pimozide  ranolazine  red yeast rice This medicine may also interact with the following medications:  buspirone  carbamazepine  cimetidine  cyclosporine  digoxin  local anesthetics or general anesthetics  lovastatin  medicines for anxiety or difficulty sleeping like midazolam and triazolam  medicines for high blood pressure or heart problems  quinidine  rifampin, rifabutin, or rifapentine This list may not describe all possible interactions. Give your health care provider a list of all the medicines, herbs, non-prescription drugs, or dietary supplements you use. Also tell them if you smoke, drink alcohol, or use illegal drugs. Some items may interact with your medicine. What should I watch for while using this medicine? Check your blood pressure and pulse rate regularly. Ask your doctor or health care professional what your blood pressure and pulse rate should be and when you should contact him or her. You may feel dizzy or lightheaded. Do not drive, use machinery, or do anything that needs mental alertness until you know how this medicine affects you. To reduce the  risk of dizzy or fainting spells, do not sit or stand up quickly, especially if you are an older patient. Alcohol can make you more dizzy or increase flushing and rapid heartbeats. Avoid alcoholic drinks. What side effects may I notice from receiving this medicine? Side effects that you should report to your doctor or health  care professional as soon as possible:  allergic reactions like skin rash, itching or hives, swelling of the face, lips, or tongue  confusion, mental depression  feeling faint or lightheaded, falls  pinpoint red spots on the skin  redness, blistering, peeling or loosening of the skin, including inside the mouth  slow, irregular heartbeat  swelling of the ankles, feet  unusual bleeding or bruising Side effects that usually do not require medical attention (report to your doctor or health care professional if they continue or are bothersome):  change in sex drive or performance  constipation or diarrhea  flushing of the face  headache  nausea, vomiting  tired or weak  trouble sleeping This list may not describe all possible side effects. Call your doctor for medical advice about side effects. You may report side effects to FDA at 1-800-FDA-1088. Where should I keep my medicine? Keep out of the reach of children. Store at room temperature between 20 and 25 degrees C (68 and 77 degrees F). Protect from light. Keep container tightly closed. Throw away any unused medicine after the expiration date. NOTE: This sheet is a summary. It may not cover all possible information. If you have questions about this medicine, talk to your doctor, pharmacist, or health care provider.  2020 Elsevier/Gold Standard (2013-08-24 10:54:31)

## 2019-06-05 ENCOUNTER — Telehealth: Payer: Self-pay | Admitting: Cardiology

## 2019-06-05 NOTE — Telephone Encounter (Signed)
[facility] rec'd 63 pages of medical records from [facility].  Prepped & placed in Dr. Macky Lower box.  06/05/19  KLM

## 2019-06-29 ENCOUNTER — Other Ambulatory Visit: Payer: Self-pay | Admitting: Internal Medicine

## 2019-06-29 DIAGNOSIS — Z1231 Encounter for screening mammogram for malignant neoplasm of breast: Secondary | ICD-10-CM

## 2019-07-15 ENCOUNTER — Ambulatory Visit: Payer: Medicare Other

## 2019-07-27 ENCOUNTER — Other Ambulatory Visit: Payer: Self-pay | Admitting: Orthopedic Surgery

## 2019-07-27 DIAGNOSIS — M25512 Pain in left shoulder: Secondary | ICD-10-CM

## 2019-07-28 ENCOUNTER — Other Ambulatory Visit: Payer: Medicare Other

## 2019-07-28 ENCOUNTER — Telehealth: Payer: Self-pay | Admitting: *Deleted

## 2019-07-28 NOTE — Telephone Encounter (Signed)
   [facility] Pre-operative Risk Assessment    Request for surgical clearance:  1. What type of surgery is being performed? LEFT TOTAL SHOULDER REPLACEMENT   2. When is this surgery scheduled? TBD   3. What type of clearance is required (medical clearance vs. Pharmacy clearance to hold med vs. Both)? MEDICAL  4. Are there any medications that need to be held prior to surgery and how long? NONE LISTED   5. Practice name and name of physician performing surgery? [facility]; DR. Vonna Kotyk LANDAU   6. What is your office phone number [phone] [phone]    7.   What is your office fax number [phone]  8.   Anesthesia type (None, local, MAC, general) ? CHOICE   Julaine Hua 07/28/2019, 10:10 AM  _________________________________________________________________   (provider comments below)

## 2019-08-03 NOTE — Telephone Encounter (Signed)
08/03/2019: Spoke to son who states that the patient is not home however will pass the message to her about calling the office in regard to pre-op clearance.

## 2019-08-04 NOTE — Telephone Encounter (Signed)
   Primary Cardiologist: Will Meredith Leeds, MD  Chart reviewed as part of pre-operative protocol coverage. Patient was contacted 08/04/2019 in reference to pre-operative risk assessment for pending surgery as outlined below.  Aries Kasa was last seen on 06/03/2019 by Dr. Curt Bears.  Since that day, Angelyne Terwilliger has done well from a cardiac standpoint. She can walk any distance, go up a flight or two of stairs, and perform household chores/cooking/ADLs without experiencing chest pain or SOB. She reports palpitations have been well controlled since her potassium levels were corrected. She can easily complete 4 METs without anginal complaints.  Therefore, based on ACC/AHA guidelines, the patient would be at acceptable risk for the planned procedure without further cardiovascular testing.   I will route this recommendation to the requesting party via Epic fax function and remove from pre-op pool.  Please call with questions.  Abigail Butts, PA-C 08/04/2019, 1:20 PM

## 2019-08-04 NOTE — Telephone Encounter (Signed)
   Left a voicemail to call back on both home phone and cell phone.   Abigail Butts, PA-C 08/04/19; 1:26 PM

## 2019-08-27 ENCOUNTER — Ambulatory Visit
Admission: RE | Admit: 2019-08-27 | Discharge: 2019-08-27 | Disposition: A | Discharge status: Home / Self Care | Payer: Medicare Other | Source: Ambulatory Visit | Attending: Orthopedic Surgery | Admitting: Orthopedic Surgery

## 2019-08-27 ENCOUNTER — Other Ambulatory Visit: Payer: Self-pay

## 2019-08-27 ENCOUNTER — Ambulatory Visit
Admission: RE | Admit: 2019-08-27 | Discharge: 2019-08-27 | Disposition: A | Discharge status: Home / Self Care | Payer: Medicare Other | Source: Ambulatory Visit | Attending: Internal Medicine | Admitting: Internal Medicine

## 2019-08-27 DIAGNOSIS — M25512 Pain in left shoulder: Secondary | ICD-10-CM

## 2019-08-27 DIAGNOSIS — Z1231 Encounter for screening mammogram for malignant neoplasm of breast: Secondary | ICD-10-CM

## 2019-08-31 ENCOUNTER — Ambulatory Visit: Payer: Medicare Other | Admitting: Cardiology

## 2020-01-06 ENCOUNTER — Other Ambulatory Visit: Payer: Self-pay | Admitting: Orthopedic Surgery

## 2020-01-06 DIAGNOSIS — M533 Sacrococcygeal disorders, not elsewhere classified: Secondary | ICD-10-CM

## 2020-01-22 ENCOUNTER — Ambulatory Visit
Admission: RE | Admit: 2020-01-22 | Discharge: 2020-01-22 | Disposition: A | Discharge status: Home / Self Care | Payer: Medicare Other | Source: Ambulatory Visit | Attending: Orthopedic Surgery | Admitting: Orthopedic Surgery

## 2020-01-22 ENCOUNTER — Other Ambulatory Visit: Payer: Self-pay

## 2020-01-22 DIAGNOSIS — M533 Sacrococcygeal disorders, not elsewhere classified: Secondary | ICD-10-CM

## 2020-02-10 ENCOUNTER — Encounter (HOSPITAL_COMMUNITY): Payer: Self-pay | Admitting: Emergency Medicine

## 2020-02-10 ENCOUNTER — Inpatient Hospital Stay (HOSPITAL_COMMUNITY)
Admission: EM | Admit: 2020-02-10 | Discharge: 2020-02-12 | DRG: 378 | Disposition: A | Discharge status: Home / Self Care | Payer: Medicare Other | Attending: Internal Medicine | Admitting: Internal Medicine

## 2020-02-10 ENCOUNTER — Other Ambulatory Visit: Payer: Self-pay

## 2020-02-10 ENCOUNTER — Observation Stay (HOSPITAL_COMMUNITY): Payer: Medicare Other

## 2020-02-10 DIAGNOSIS — Z8774 Personal history of (corrected) congenital malformations of heart and circulatory system: Secondary | ICD-10-CM

## 2020-02-10 DIAGNOSIS — M47816 Spondylosis without myelopathy or radiculopathy, lumbar region: Secondary | ICD-10-CM | POA: Diagnosis present

## 2020-02-10 DIAGNOSIS — Z6829 Body mass index (BMI) 29.0-29.9, adult: Secondary | ICD-10-CM

## 2020-02-10 DIAGNOSIS — K254 Chronic or unspecified gastric ulcer with hemorrhage: Principal | ICD-10-CM | POA: Diagnosis present

## 2020-02-10 DIAGNOSIS — K922 Gastrointestinal hemorrhage, unspecified: Secondary | ICD-10-CM

## 2020-02-10 DIAGNOSIS — Z8249 Family history of ischemic heart disease and other diseases of the circulatory system: Secondary | ICD-10-CM

## 2020-02-10 DIAGNOSIS — E669 Obesity, unspecified: Secondary | ICD-10-CM | POA: Diagnosis present

## 2020-02-10 DIAGNOSIS — I10 Essential (primary) hypertension: Secondary | ICD-10-CM | POA: Diagnosis not present

## 2020-02-10 DIAGNOSIS — R944 Abnormal results of kidney function studies: Secondary | ICD-10-CM | POA: Diagnosis present

## 2020-02-10 DIAGNOSIS — Z20822 Contact with and (suspected) exposure to covid-19: Secondary | ICD-10-CM | POA: Diagnosis present

## 2020-02-10 DIAGNOSIS — I421 Obstructive hypertrophic cardiomyopathy: Secondary | ICD-10-CM | POA: Diagnosis present

## 2020-02-10 DIAGNOSIS — Z981 Arthrodesis status: Secondary | ICD-10-CM

## 2020-02-10 DIAGNOSIS — E872 Acidosis, unspecified: Secondary | ICD-10-CM | POA: Diagnosis present

## 2020-02-10 DIAGNOSIS — Z885 Allergy status to narcotic agent status: Secondary | ICD-10-CM

## 2020-02-10 DIAGNOSIS — Z833 Family history of diabetes mellitus: Secondary | ICD-10-CM

## 2020-02-10 DIAGNOSIS — M1990 Unspecified osteoarthritis, unspecified site: Secondary | ICD-10-CM

## 2020-02-10 DIAGNOSIS — D62 Acute posthemorrhagic anemia: Secondary | ICD-10-CM | POA: Diagnosis present

## 2020-02-10 DIAGNOSIS — Z800 Family history of malignant neoplasm of digestive organs: Secondary | ICD-10-CM

## 2020-02-10 DIAGNOSIS — Z79899 Other long term (current) drug therapy: Secondary | ICD-10-CM

## 2020-02-10 DIAGNOSIS — Z823 Family history of stroke: Secondary | ICD-10-CM

## 2020-02-10 DIAGNOSIS — R197 Diarrhea, unspecified: Secondary | ICD-10-CM | POA: Diagnosis present

## 2020-02-10 DIAGNOSIS — Z8261 Family history of arthritis: Secondary | ICD-10-CM

## 2020-02-10 DIAGNOSIS — I951 Orthostatic hypotension: Secondary | ICD-10-CM | POA: Diagnosis present

## 2020-02-10 DIAGNOSIS — Z791 Long term (current) use of non-steroidal anti-inflammatories (NSAID): Secondary | ICD-10-CM

## 2020-02-10 DIAGNOSIS — Z90710 Acquired absence of both cervix and uterus: Secondary | ICD-10-CM

## 2020-02-10 DIAGNOSIS — Z8719 Personal history of other diseases of the digestive system: Secondary | ICD-10-CM

## 2020-02-10 HISTORY — DX: Gastrointestinal hemorrhage, unspecified: K92.2

## 2020-02-10 LAB — URINALYSIS, ROUTINE W REFLEX MICROSCOPIC
Bacteria, UA: NONE SEEN
Bilirubin Urine: NEGATIVE
Glucose, UA: NEGATIVE mg/dL
Ketones, ur: 5 mg/dL — AB
Leukocytes,Ua: NEGATIVE
Nitrite: NEGATIVE
Protein, ur: NEGATIVE mg/dL
Specific Gravity, Urine: 1.006 (ref 1.005–1.030)
pH: 5 (ref 5.0–8.0)

## 2020-02-10 LAB — CBC WITH DIFFERENTIAL/PLATELET
Abs Immature Granulocytes: 0.03 10*3/uL (ref 0.00–0.07)
Basophils Absolute: 0.1 10*3/uL (ref 0.0–0.1)
Basophils Relative: 1 %
Eosinophils Absolute: 0.1 10*3/uL (ref 0.0–0.5)
Eosinophils Relative: 1 %
HCT: 34.3 % — ABNORMAL LOW (ref 36.0–46.0)
Hemoglobin: 11.4 g/dL — ABNORMAL LOW (ref 12.0–15.0)
Immature Granulocytes: 0 %
Lymphocytes Relative: 27 %
Lymphs Abs: 1.9 10*3/uL (ref 0.7–4.0)
MCH: 30.6 pg (ref 26.0–34.0)
MCHC: 33.2 g/dL (ref 30.0–36.0)
MCV: 92 fL (ref 80.0–100.0)
Monocytes Absolute: 0.5 10*3/uL (ref 0.1–1.0)
Monocytes Relative: 7 %
Neutro Abs: 4.4 10*3/uL (ref 1.7–7.7)
Neutrophils Relative %: 64 %
Platelets: 204 10*3/uL (ref 150–400)
RBC: 3.73 MIL/uL — ABNORMAL LOW (ref 3.87–5.11)
RDW: 12.3 % (ref 11.5–15.5)
WBC: 6.9 10*3/uL (ref 4.0–10.5)
nRBC: 0 % (ref 0.0–0.2)

## 2020-02-10 LAB — COMPREHENSIVE METABOLIC PANEL
ALT: 22 U/L (ref 0–44)
AST: 23 U/L (ref 15–41)
Albumin: 3.9 g/dL (ref 3.5–5.0)
Alkaline Phosphatase: 34 U/L — ABNORMAL LOW (ref 38–126)
Anion gap: 11 (ref 5–15)
BUN: 35 mg/dL — ABNORMAL HIGH (ref 8–23)
CO2: 23 mmol/L (ref 22–32)
Calcium: 9.4 mg/dL (ref 8.9–10.3)
Chloride: 105 mmol/L (ref 98–111)
Creatinine, Ser: 0.89 mg/dL (ref 0.44–1.00)
GFR calc Af Amer: >60 mL/min (ref >60)
GFR calc non Af Amer: >60 mL/min (ref >60)
Glucose, Bld: 107 mg/dL — ABNORMAL HIGH (ref 70–99)
Potassium: 4.2 mmol/L (ref 3.5–5.1)
Sodium: 139 mmol/L (ref 135–145)
Total Bilirubin: 0.9 mg/dL (ref 0.3–1.2)
Total Protein: 6.3 g/dL — ABNORMAL LOW (ref 6.5–8.1)

## 2020-02-10 LAB — HEMOGLOBIN AND HEMATOCRIT, BLOOD
HCT: 32.1 % — ABNORMAL LOW (ref 36.0–46.0)
HCT: 28 % — ABNORMAL LOW (ref 36.0–46.0)
Hemoglobin: 10.8 g/dL — ABNORMAL LOW (ref 12.0–15.0)
Hemoglobin: 9.6 g/dL — ABNORMAL LOW (ref 12.0–15.0)

## 2020-02-10 LAB — TYPE AND SCREEN
ABO/RH(D): A POS
Antibody Screen: NEGATIVE

## 2020-02-10 LAB — LACTIC ACID, PLASMA
Lactic Acid, Venous: 2.6 mmol/L (ref 0.5–1.9)
Lactic Acid, Venous: 1.4 mmol/L (ref 0.5–1.9)

## 2020-02-10 LAB — TROPONIN I (HIGH SENSITIVITY)
Troponin I (High Sensitivity): 2 ng/L (ref <18)
Troponin I (High Sensitivity): 3 ng/L (ref <18)

## 2020-02-10 LAB — HIV ANTIBODY (ROUTINE TESTING W REFLEX): HIV Screen 4th Generation wRfx: NONREACTIVE

## 2020-02-10 LAB — LIPASE, BLOOD: Lipase: 27 U/L (ref 11–51)

## 2020-02-10 LAB — PROTIME-INR
INR: 1 (ref 0.8–1.2)
Prothrombin Time: 13.2 seconds (ref 11.4–15.2)

## 2020-02-10 LAB — SARS CORONAVIRUS 2 BY RT PCR (HOSPITAL ORDER, PERFORMED IN ~~LOC~~ HOSPITAL LAB): SARS Coronavirus 2: NEGATIVE

## 2020-02-10 LAB — POC OCCULT BLOOD, ED: Fecal Occult Bld: POSITIVE — AB

## 2020-02-10 LAB — ABO/RH: ABO/RH(D): A POS

## 2020-02-10 MED ORDER — ONDANSETRON HCL 4 MG PO TABS: 4.0000 mg | ORAL_TABLET | Freq: Four times a day (QID) | ORAL | Status: DC | PRN

## 2020-02-10 MED ORDER — PANTOPRAZOLE SODIUM 40 MG IV SOLR
40.0000 mg | Freq: Once | INTRAVENOUS | Status: AC
  Administered 2020-02-10: 40 mg via INTRAVENOUS
  Filled 2020-02-10: qty 40

## 2020-02-10 MED ORDER — ACETAMINOPHEN 325 MG PO TABS
650.0000 mg | ORAL_TABLET | Freq: Four times a day (QID) | ORAL | Status: DC | PRN
  Administered 2020-02-11: 650 mg via ORAL
  Filled 2020-02-10: qty 2

## 2020-02-10 MED ORDER — SODIUM CHLORIDE 0.9 % IV SOLN
INTRAVENOUS | Status: DC
  Administered 2020-02-10: 1000 mL via INTRAVENOUS

## 2020-02-10 MED ORDER — ACETAMINOPHEN 650 MG RE SUPP: 650.0000 mg | Freq: Four times a day (QID) | RECTAL | Status: DC | PRN

## 2020-02-10 MED ORDER — SODIUM CHLORIDE 0.9 % IV SOLN
8.0000 mg/h | INTRAVENOUS | Status: DC
  Administered 2020-02-10 – 2020-02-11 (×2): 8 mg/h via INTRAVENOUS
  Filled 2020-02-10 (×3): qty 80

## 2020-02-10 MED ORDER — PANTOPRAZOLE SODIUM 40 MG IV SOLR: 40.0000 mg | Freq: Two times a day (BID) | INTRAVENOUS | Status: DC

## 2020-02-10 MED ORDER — ONDANSETRON HCL 4 MG/2ML IJ SOLN: 4.0000 mg | Freq: Four times a day (QID) | INTRAMUSCULAR | Status: DC | PRN

## 2020-02-10 MED ORDER — SODIUM CHLORIDE 0.9% FLUSH
3.0000 mL | Freq: Two times a day (BID) | INTRAVENOUS | Status: DC
  Administered 2020-02-11: 3 mL via INTRAVENOUS

## 2020-02-10 NOTE — H&P (View-Only) (Signed)
[facility] Consult: 4:14 PM 02/10/2020  LOS: 0 days    Referring Provider: Dr Marylou Mccoy in ED  Primary Care Physician:  Tisovec, Adelfa Koh, MD Primary Gastroenterologist: A Dr. [place], [facility]    Reason for Consultation:     HPI: Jennifer Baxter is a 69 y.o. female.  PMH hypertension.  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  Osteoarthritis.  Cholecystectomy.  Total abdominal hysterectomy. Previous Dielafoy's related GI bleeds.  First was many years ago in [place].  2nd was in [place] where Dr Fayrene Fearing performed EGD Undergoes regular colonoscopy for hx polyps and family history of colon cancer in her brother.  The last colonoscopy was 2 years ago by a doctor in [place], [place].  She had some "benign" polyps. Does not take PPI as she does not have GERD symptoms.  Normally has a good appetite and has daily bowel movements right  7 AM this morning she awoke with a sense of lightheadedness.  She took her blood pressure, systolic reading 557.  She had worked in the yard yesterday and thought maybe she was dehydrated so she drank a lot of water but she remained dizzy and then got presyncopal.  She laid down.  She then proceeded to have a watery black stool.  1 was at home, 1 was on the way by ambulance to the ED and she has had 2 in the emergency department.  No nausea, vomiting, abdominal pain though on physical exam the epigastrium is tender. She takes 81 mg aspirin 3 times a week.  Takes Aleve 500 mg, 1 pill 3 times a week for arthritis.  BP soft 90s to low 100s/60s to 70s.  Heart rate 90s to low 1 teens.  Hb 11.4 (14.5 in 04/2019), MCV 92.  Elevated BUN at 35, elcreatinine normal.  Evated lactate at 2.6. Protonix drip initiated.  Lives with her husband.  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  Moved around a lot because her husband was in the [employer]. Does not drink alcohol.  Past Medical History:  Diagnosis Date  . Arrhythmia   . Arthritis   . Degenerative joint disease (DJD) of lumbar spine   . DJD (degenerative joint disease)   . GI bleed   . HOCM (hypertrophic obstructive cardiomyopathy) (HCC)   . Hypertension   . Obesity   . PVC's (premature ventricular contractions)     Past Surgical History:  Procedure Laterality Date  . ABDOMINAL HYSTERECTOMY    . CERVICAL DISCECTOMY    . CERVICAL FUSION    . CHOLECYSTECTOMY    . LUMBAR DISC SURGERY    . MENISCECTOMY    . THORACIC DISCECTOMY      Prior to Admission medications   Medication Sig Start Date End Date Taking? Authorizing Provider  amLODipine (NORVASC) 5 MG tablet Take 2.5 mg by mouth daily.    Yes [provider]  chlorthalidone (HYGROTON) 25 MG tablet Take 25 mg by mouth See admin instructions. Takes 25  mg 2 times a week.   Yes [provider]  Cholecalciferol (VITAMIN D) 125 MCG (5000 UT) CAPS Take 5,000 Units by mouth See admin instructions. Takes 5000 units by mouth 3 times a week (Monday,Wednesday and Friday)   Yes [provider]  metoprolol succinate (TOPROL-XL) 100 MG 24 hr tablet Take 100 mg by mouth 2 (two) times daily. Take with or immediately following a meal.   Yes [provider]  naproxen (NAPROSYN) 500 MG tablet Take 500 mg by mouth as needed for moderate pain.    Yes [provider]  potassium chloride (MICRO-K) 10 MEQ CR capsule Take 10 mEq by mouth See admin instructions. Takes 10 mEq by mouth 2 times a week. 01/05/20  Yes [provider]  diltiazem (CARDIZEM CD) 120 MG 24 hr capsule Take 1 capsule (120 mg total) by mouth daily. Patient not taking: Reported on 02/10/2020 06/03/19   Regan Lemming, MD    Scheduled Meds:  Infusions:  PRN Meds:    Allergies as of 02/10/2020 -  Review Complete 02/10/2020  Allergen Reaction Noted  . Codeine Hives, Itching, and Rash 04/10/2012  . Morphine Hives, Itching, and Other (See Comments) 04/10/2012    Family History  Problem Relation Age of Onset  . Arthritis Mother   . Diabetes Mellitus I Mother   . Hypertension Mother   . Arthritis Father   . Hypertension Father   . Arthritis Sister   . Hypertension Sister   . Stroke Sister   . Alcoholism Brother   . Colon cancer Brother   . Hypertension Brother   . Diabetes Mellitus I Maternal Grandmother   . Heart disease Paternal Grandmother   . Hypertension Paternal Grandfather   . Stroke Paternal Grandfather   . Breast cancer Neg Hx     Social History   Socioeconomic History  . Marital status: Married    Spouse name: Not on file  . Number of children: Not on file  . Years of education: Not on file  . Highest education level: Not on file  Occupational History  . Not on file  Tobacco Use  . Smoking status: Never Smoker  . Smokeless tobacco: Never Used  Substance and Sexual Activity  . Alcohol use: No  . Drug use: No  . Sexual activity: Not on file  Other Topics Concern  . Not on file  Social History Narrative  . Not on file   Social Determinants of Health   Financial Resource Strain:   . Difficulty of Paying Living Expenses:   Food Insecurity:   . Worried About Programme researcher, broadcasting/film/video in the Last Year:   . Barista in the Last Year:   Transportation Needs:   . Freight forwarder (Medical):   Marland Kitchen Lack of Transportation (Non-Medical):   Physical Activity:   . Days of Exercise per Week:   . Minutes of Exercise per Session:   Stress:   . Feeling of Stress :   Social Connections:   . Frequency of Communication with Friends and Family:   . Frequency of Social Gatherings with Friends and Family:   . Attends Religious Services:   . Active Member of Clubs or Organizations:   . Attends Banker Meetings:   Marland Kitchen Marital Status:   Intimate  Partner Violence:   . Fear of Current or Ex-Partner:   . Emotionally Abused:   Marland Kitchen Physically Abused:   . Sexually Abused:     REVIEW  OF SYSTEMS: Constitutional: Weakness, dizziness. ENT:  No nose bleeds Pulm: No shortness of breath.  No cough. CV: Some sense of tachycardia but no arrhythmia.  No chest pain no LE edema.  GU:  No hematuria, no frequency GI: See HPI. Heme: Normally has no issues with unusual or excessive bleeding. Transfusions: When she has had Dula Foy's in the past she is required blood transfusion. Neuro: Presyncope.  No LOC. Derm:  No itching, no rash or sores.  Endocrine:  No sweats or chills.  No polyuria or dysuria Immunization: Has not been vaccinated for Covid.  She has moral issues.  She believes that the vaccines have been manufactured and developed using fetal cells from aborted fetuses, as a Catholic she is opposed to this. Travel:  None beyond local counties in last few months.    PHYSICAL EXAM: Vital signs in last 24 hours: Vitals:   02/10/20 1515 02/10/20 1545  BP: 102/73 100/76  Pulse: 90 (!) 111  Resp: 13 20  Temp:    SpO2: 99% 99%   Wt Readings from Last 3 Encounters:  02/10/20 81.6 kg  06/03/19 109 kg  12/31/17 112.9 kg    General: Obese, pale, alert, comfortable Head: No facial asymmetry or swelling.  No signs of head trauma. Eyes: No conjunctival pallor.  No scleral icterus.  EOMI. Ears: Not hard of hearing Nose: No congestion or discharge Mouth: Tongue midline.  Good dentition.  Moist, pink, clear mucosa. Neck: No JVD, thyromegaly or masses Lungs: Clear bilaterally.  No labored breathing. Heart: Regular.  Slightly tacky at about 112.  No MRG.  S1, S2 present Abdomen: Obese.  Soft.  No HSM, masses, bruits, hernias.  Very focal mild to moderate tenderness way up in the apex of the epigastrium..   Rectal: Deferred.  Stool sent to the lab is FOBT + Musc/Skeltl: No joint redness, swelling or gross deformities. Extremities: No  CCE. Neurologic: Alert.  Oriented x3.  No gross weakness or deficits.  No tremors. Skin: No purpura, no rash, no suspicious lesions Nodes: No cervical adenopathy Psych: Cooperative, pleasant, calm.  Fluid speech.  Excellent historian.  Intake/Output from previous day: No intake/output data recorded. Intake/Output this shift: No intake/output data recorded.  LAB RESULTS: Recent Labs    02/10/20 1452  WBC 6.9  HGB 11.4*  HCT 34.3*  PLT 204   BMET Lab Results  Component Value Date   NA 139 02/10/2020   NA 138 05/24/2019   K 4.2 02/10/2020   K 2.8 (L) 05/24/2019   CL 105 02/10/2020   CL 101 05/24/2019   CO2 23 02/10/2020   CO2 20 (L) 05/24/2019   GLUCOSE 107 (H) 02/10/2020   GLUCOSE 118 (H) 05/24/2019   BUN 35 (H) 02/10/2020   BUN 20 05/24/2019   CREATININE 0.89 02/10/2020   CREATININE 1.10 (H) 05/24/2019   CALCIUM 9.4 02/10/2020   CALCIUM 9.8 05/24/2019   LFT Recent Labs    02/10/20 1452  PROT 6.3*  ALBUMIN 3.9  AST 23  ALT 22  ALKPHOS 34*  BILITOT 0.9   PT/INR Lab Results  Component Value Date   INR 1.0 02/10/2020   Hepatitis Panel No results for input(s): HEPBSAG, HCVAB, HEPAIGM, HEPBIGM in the last 72 hours. C-Diff No components found for: CDIFF Lipase     Component Value Date/Time   LIPASE 27 02/10/2020 1452     RADIOLOGY STUDIES: No results found.   IMPRESSION:   *    GI bleed.  Suspect upper.  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       PLAN:     *    Agree with allowing clears but n.p.o. after midnight so that she will be ready for EGD tomorrow, timing TBD.  *    Leave IV Protonix drip in place.   Azucena Freed  02/10/2020, 4:14 PM Phone [phone]  ________________________________________________________________________  Velora Heckler GI MD note:  I personally examined the patient, reviewed the data and agree with the assessment and plan described above.  She's obviously having UGI bleeding, painless. Most  likely this is another Dieulafoy lesion of the stomach, however she does take alleve 3 times weekly and so perhaps PUD.  I don't think she is actively bleeding right now. We are planning EGD tomorrow, currently on schedule for 11AM.   Owens Loffler, MD [facility] Pager [phone]

## 2020-02-10 NOTE — ED Notes (Signed)
1L normal saline bolus started per verbal order from Dr Katrinka Blazing

## 2020-02-10 NOTE — ED Provider Notes (Signed)
[facility] Provider Note   CSN: [id number] Arrival date & time: 02/10/20  1358     History Chief Complaint  Patient presents with  . GI Bleeding    Rufina Falcoileen Hyle is a 69 y.o. female.  The history is provided by the patient, the spouse and medical records. No language interpreter was used.  Rectal Bleeding Quality:  Black and tarry Amount:  Copious Duration:  1 day Timing:  Constant Chronicity:  Recurrent Similar prior episodes: yes   Relieved by:  Nothing Worsened by:  Nothing Ineffective treatments:  None tried Associated symptoms: abdominal pain and light-headedness   Associated symptoms: no dizziness, no epistaxis, no fever, no hematemesis, no loss of consciousness, no recent illness and no vomiting        Past Medical History:  Diagnosis Date  . Arrhythmia   . Arthritis   . Degenerative joint disease (DJD) of lumbar spine   . DJD (degenerative joint disease)   . GI bleed   . HOCM (hypertrophic obstructive cardiomyopathy) (HCC)   . Hypertension   . Obesity   . PVC's (premature ventricular contractions)     Patient Active Problem List   Diagnosis Date Noted  . Hoarseness 12/31/2017    Past Surgical History:  Procedure Laterality Date  . ABDOMINAL HYSTERECTOMY    . CERVICAL DISCECTOMY    . CERVICAL FUSION    . CHOLECYSTECTOMY    . LUMBAR DISC SURGERY    . MENISCECTOMY    . THORACIC DISCECTOMY       OB History   No obstetric history on file.     Family History  Problem Relation Age of Onset  . Arthritis Mother   . Diabetes Mellitus I Mother   . Hypertension Mother   . Arthritis Father   . Hypertension Father   . Arthritis Sister   . Hypertension Sister   . Stroke Sister   . Alcoholism Brother   . Colon cancer Brother   . Hypertension Brother   . Diabetes Mellitus I Maternal Grandmother   . Heart disease Paternal Grandmother   . Hypertension Paternal Grandfather   . Stroke Paternal Grandfather     . Breast cancer Neg Hx     Social History   Tobacco Use  . Smoking status: Never Smoker  . Smokeless tobacco: Never Used  Substance Use Topics  . Alcohol use: No  . Drug use: No    Home Medications Prior to Admission medications   Medication Sig Start Date End Date Taking? Authorizing Provider  amLODipine (NORVASC) 5 MG tablet Take 5 mg by mouth daily.    [provider]  chlorthalidone (HYGROTON) 25 MG tablet Take 25 mg by mouth every 3 (three) days.    [provider]  diltiazem (CARDIZEM CD) 120 MG 24 hr capsule Take 1 capsule (120 mg total) by mouth daily. 06/03/19   Camnitz, Andree CossWill Martin, MD  metoprolol succinate (TOPROL-XL) 100 MG 24 hr tablet Take 100 mg by mouth 2 (two) times daily. Take with or immediately following a meal.    [provider]  naproxen (NAPROSYN) 500 MG tablet Take 500 mg by mouth as needed.    [provider]    Allergies    Codeine and Morphine  Review of Systems   Review of Systems  Constitutional: Positive for fatigue. Negative for chills, diaphoresis and fever.  HENT: Negative for congestion and nosebleeds.   Eyes: Negative for visual disturbance.  Respiratory: Negative for cough,  chest tightness, shortness of breath and wheezing.   Cardiovascular: Negative for chest pain, palpitations and leg swelling.  Gastrointestinal: Positive for abdominal pain and hematochezia. Negative for abdominal distention, constipation, diarrhea, hematemesis, nausea and vomiting.  Genitourinary: Negative for dysuria and flank pain.  Musculoskeletal: Negative for back pain, neck pain and neck stiffness.  Skin: Negative for rash and wound.  Neurological: Positive for light-headedness. Negative for dizziness, loss of consciousness, syncope, weakness, numbness and headaches.  Psychiatric/Behavioral: Negative for agitation.  All other systems reviewed and are negative.   Physical Exam Updated Vital Signs BP 130/84 (BP Location: Right  Arm)   Pulse 100   Temp 98.2 F (36.8 C) (Oral)   Ht 5\' 6"  (1.676 m)   Wt 81.6 kg   SpO2 100%   BMI 29.05 kg/m   Physical Exam Vitals and nursing note reviewed.  Constitutional:      General: She is not in acute distress.    Appearance: She is well-developed. She is not ill-appearing, toxic-appearing or diaphoretic.  HENT:     Head: Normocephalic and atraumatic.     Right Ear: External ear normal.     Left Ear: External ear normal.     Nose: Nose normal. No congestion or rhinorrhea.     Mouth/Throat:     Mouth: Mucous membranes are moist.     Pharynx: No oropharyngeal exudate or posterior oropharyngeal erythema.  Eyes:     Extraocular Movements: Extraocular movements intact.     Conjunctiva/sclera: Conjunctivae normal.     Pupils: Pupils are equal, round, and reactive to light.  Cardiovascular:     Rate and Rhythm: Tachycardia present.     Pulses: Normal pulses.     Heart sounds: No murmur.  Pulmonary:     Effort: No respiratory distress.     Breath sounds: No stridor.  Abdominal:     General: Abdomen is flat. There is no distension.     Tenderness: There is no abdominal tenderness. There is no right CVA tenderness, left CVA tenderness or rebound.  Genitourinary:    Rectum: Guaiac result positive.  Musculoskeletal:        General: No tenderness.     Cervical back: Normal range of motion and neck supple. No tenderness.     Right lower leg: No edema.     Left lower leg: No edema.  Skin:    General: Skin is warm.     Capillary Refill: Capillary refill takes less than 2 seconds.     Coloration: Skin is pale.     Findings: No erythema or rash.  Neurological:     General: No focal deficit present.     Mental Status: She is alert and oriented to person, place, and time.     Sensory: No sensory deficit.     Motor: No weakness or abnormal muscle tone.     Deep Tendon Reflexes: Reflexes are normal and symmetric.  Psychiatric:        Mood and Affect: Mood normal.      ED Results / Procedures / Treatments   Labs (all labs ordered are listed, but only abnormal results are displayed) Labs Reviewed  CBC WITH DIFFERENTIAL/PLATELET - Abnormal; Notable for the following components:      Result Value   RBC 3.73 (*)    Hemoglobin 11.4 (*)    HCT 34.3 (*)    All other components within normal limits  COMPREHENSIVE METABOLIC PANEL - Abnormal; Notable for the following components:   Glucose,  Bld 107 (*)    BUN 35 (*)    Total Protein 6.3 (*)    Alkaline Phosphatase 34 (*)    All other components within normal limits  URINALYSIS, ROUTINE W REFLEX MICROSCOPIC - Abnormal; Notable for the following components:   Color, Urine COLORLESS (*)    Hgb urine dipstick SMALL (*)    Ketones, ur 5 (*)    All other components within normal limits  LACTIC ACID, PLASMA - Abnormal; Notable for the following components:   Lactic Acid, Venous 2.6 (*)    All other components within normal limits  HEMOGLOBIN AND HEMATOCRIT, BLOOD - Abnormal; Notable for the following components:   Hemoglobin 10.8 (*)    HCT 32.1 (*)    All other components within normal limits  POC OCCULT BLOOD, ED - Abnormal; Notable for the following components:   Fecal Occult Bld POSITIVE (*)    All other components within normal limits  SARS CORONAVIRUS 2 BY RT PCR (HOSPITAL ORDER, PERFORMED IN [facility])  URINE CULTURE  PROTIME-INR  LIPASE, BLOOD  LACTIC ACID, PLASMA  HIV ANTIBODY (ROUTINE TESTING W REFLEX)  HEMOGLOBIN AND HEMATOCRIT, BLOOD  CBC  BASIC METABOLIC PANEL  TYPE AND SCREEN  ABO/RH  TROPONIN I (HIGH SENSITIVITY)  TROPONIN I (HIGH SENSITIVITY)    EKG EKG Interpretation  Date/Time:  Wednesday Feb 10 2020 15:35:53 EDT Ventricular Rate:  95 PR Interval:    QRS Duration: 101 QT Interval:  370 QTC Calculation: 466 R Axis:   -27 Text Interpretation: Sinus rhythm Abnormal R-wave progression, early transition Inferior infarct, old When comapred to prior, slower  rate. No STEMI Confirmed by Theda Belfast ([id number]) on 02/10/2020 3:44:30 PM   Radiology DG CHEST PORT 1 VIEW  Result Date: 02/10/2020 CLINICAL DATA:  Gastrointestinal bleeding EXAM: PORTABLE CHEST 1 VIEW COMPARISON:  05/24/2019 FINDINGS: Single frontal view of the chest demonstrates an unremarkable cardiac silhouette. No airspace disease, effusion, or pneumothorax. No acute bony abnormality. IMPRESSION: 1. No acute intrathoracic process. Electronically Signed   By: Sharlet Salina M.D.   On: 02/10/2020 17:36    Procedures Procedures (including critical care time)  Medications Ordered in ED Medications  pantoprazole (PROTONIX) 80 mg in sodium chloride 0.9 % 100 mL (0.8 mg/mL) infusion (has no administration in time range)  pantoprazole (PROTONIX) injection 40 mg (has no administration in time range)  sodium chloride flush (NS) 0.9 % injection 3 mL (has no administration in time range)  0.9 %  sodium chloride infusion (1,000 mLs Intravenous New Bag/Given 02/10/20 2012)  acetaminophen (TYLENOL) tablet 650 mg (has no administration in time range)    Or  acetaminophen (TYLENOL) suppository 650 mg (has no administration in time range)  ondansetron (ZOFRAN) tablet 4 mg (has no administration in time range)    Or  ondansetron (ZOFRAN) injection 4 mg (has no administration in time range)  pantoprazole (PROTONIX) injection 40 mg (40 mg Intravenous Given 02/10/20 1534)    ED Course  I have reviewed the triage vital signs and the nursing notes.  Pertinent labs & imaging results that were available during my care of the patient were reviewed by me and considered in my medical decision making (see chart for details).    MDM Rules/Calculators/A&P                      Jeannette Maddy is a 69 y.o. female with a past medical history significant for HOCM, hypertension, degenerative disease, and prior upper GI  bleeding who presents with dark tarry stools, lightheadedness, near syncope, and abdominal  discomfort.  Patient reports that she has been feeling extremely well for the last few days but this morning woke up and was feeling very tired and fatigued.  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  She reports that her blood pressure went from the 110 range to 120 range while laying to being unreadable and symptomatic when she stood up.  She nearly passed out with this.  She does feel pale and is having dark tarry stools constantly today.  She is concerned about upper GI bleed again.  She reports the last time she had nausea and vomiting with hematemesis which she is not having today.  She does report some mild abdominal aching that is not constant.  She reports no chest pain, palpitations, or shortness of breath.  She denies syncope.  She denies recent trauma.  She denies any urinary symptoms.  She denies any sick contacts including no recent Covid exposures.  She was last managed by GI in [place] 8 years ago with the last bleed.  She does not take blood thinners.  On exam, abdomen is nontender.  Bowel sounds were appreciated.  Patient's stool smelled like a GI bleed.  Lungs clear and chest nontender.  No murmur.  Good pulses in extremities.  Mucous membranes very pale.  When patient sat up to listen to her lungs, her heart rate jumped up to around 138.  She was symptomatic and got lightheaded when she sat up.  Clinically I suspect patient is having recurrent GI bleed and is causing her to feel symptomatic anemia.  We will give her Protonix and get a type and screen.  Will wait until hemoglobin is returned before consideration of blood transfusion as she is not hypotensive at this time.  She was symptomatic when she sat up thus, do not feel he will be safe for discharge home.  Anticipate calling gastroenterology when her work-up is completed and admission.  We will get other screening labs with the abdominal  discomfort and her lightheadedness and near syncope.  We will hold on fluids given the reassuring blood pressure initially and not wanting to dilute her further as we suspect GI bleed.  Anticipate admission.  3:52 PM\ Patient was reassessed and she is feeling lightheaded.  Heart rate is still around 100 and her blood pressures also around 161 systolic.  She still feels lightheaded.  She still denies chest pain or shortness of breath.  Fecal occult test was positive as anticipated.  Type and screen was sent.  CBC shows her hemoglobin is dropped to 11.4 down from 14 previously.  I anticipate this is still going to be downtrending as she just had another large dark tarry stool in the emergency department.  Otherwise, lactic acid was elevated 2.6.  Urinalysis is in process.  Metabolic panel shows no AKI.  No leukocytosis.  Coronavirus test is in process and troponin is negative.  Lipase not elevated.  INR is normal at 1.0.  We will call gastroenterology to get the recommendations however I feel she will likely admission for her orthostatic symptoms when sitting up, her decreasing blood pressures, and her continued likely upper GI bleed similar to prior.  Will hold on blood transfusion at this time and speak to gastroenterology for further recommendations.  Just spoke with the consultant with [facility] gastroenterology who agree with  admission to medicine, they will see the patient in consultation and requested she be n.p.o. after midnight for likely evaluation with endoscopy or colonoscopy tomorrow.  They agree with admission to medicine for hemoglobin trending and blood transfusion if needed.  Medicine team will be called for admission for further management of likely upper GI bleed causing orthostatic hypotension, near syncope, and soft blood pressures.   Final Clinical Impression(s) / ED Diagnoses Final diagnoses:  Acute GI bleeding     Clinical Impression: 1. Acute GI bleeding     Disposition:  Admit  This note was prepared with assistance of Dragon voice recognition software. Occasional wrong-word or sound-a-like substitutions may have occurred due to the inherent limitations of voice recognition software.     Aseneth Hack, Canary Brim, MD 02/10/20 2015

## 2020-02-10 NOTE — ED Notes (Signed)
ED TO INPATIENT HANDOFF REPORT  ED Nurse Name and Phone #:  Monique,RN   S Name/Age/Gender Jennifer FalcoEileen Baxter 69 y.o. female Room/Bed: 012C/012C  Code Status   Code Status: Full Code  Home/SNF/Other Home Patient oriented to: self, place, time and situation Is this baseline? Yes   Triage Complete: Triage complete  Chief Complaint Upper GI bleed [K92.2]  Triage Note No notes on file   Allergies Allergies  Allergen Reactions  . Codeine Hives, Itching and Rash  . Morphine Hives, Itching and Other (See Comments)    Questionable Arm swelled     Level of Care/Admitting Diagnosis ED Disposition    ED Disposition Condition Comment   Admit  [facility] [[id number]]  Level of Care: Progressive [102]  Admit to Progressive based on following criteria: GI, ENDOCRINE disease patients with GI bleeding, acute liver failure or pancreatitis, stable with diabetic ketoacidosis or thyrotoxicosis (hypothyroid) state.  Covid Evaluation: Asymptomatic Screening Protocol (No Symptoms)  Diagnosis: Upper GI bleed [267195]  Admitting Physician: Clydie BraunSMITH, RONDELL A [[id number]]  Attending Physician: Clydie BraunSMITH, RONDELL A [[id number]]       B Medical/Surgery History Past Medical History:  Diagnosis Date  . Arrhythmia   . Arthritis   . Degenerative joint disease (DJD) of lumbar spine   . DJD (degenerative joint disease)   . GI bleed   . HOCM (hypertrophic obstructive cardiomyopathy) (HCC)   . Hypertension   . Obesity   . PVC's (premature ventricular contractions)    Past Surgical History:  Procedure Laterality Date  . ABDOMINAL HYSTERECTOMY    . CERVICAL DISCECTOMY    . CERVICAL FUSION    . CHOLECYSTECTOMY    . LUMBAR DISC SURGERY    . MENISCECTOMY    . THORACIC DISCECTOMY       A IV Location/Drains/Wounds Patient Lines/Drains/Airways Status   Active Line/Drains/Airways    Name:   Placement date:   Placement time:   Site:   Days:   Peripheral IV 05/24/19 Right  Antecubital   05/24/19    0847    Antecubital   262   Peripheral IV 02/10/20 Left Antecubital   02/10/20    1444    Antecubital   less than 1          Intake/Output Last 24 hours  Intake/Output Summary (Last 24 hours) at 02/10/2020 2239 Last data filed at 02/10/2020 2221 Gross per 24 hour  Intake --  Output 1800 ml  Net -1800 ml    Labs/Imaging Results for orders placed or performed during the hospital encounter of 02/10/20 (from the past 48 hour(s))  SARS Coronavirus 2 by RT PCR (hospital order, performed in [facility] hospital lab) Nasopharyngeal Nasopharyngeal Swab     Status: None   Collection Time: 02/10/20  2:33 PM   Specimen: Nasopharyngeal Swab  Result Value Ref Range   SARS Coronavirus 2 NEGATIVE NEGATIVE    Comment: (NOTE) SARS-CoV-2 target nucleic acids are NOT DETECTED. The SARS-CoV-2 RNA is generally detectable in upper and lower respiratory specimens during the acute phase of infection. The lowest concentration of SARS-CoV-2 viral copies this assay can detect is 250 copies / mL. A negative result does not preclude SARS-CoV-2 infection and should not be used as the sole basis for treatment or other patient management decisions.  A negative result may occur with improper specimen collection / handling, submission of specimen other than nasopharyngeal swab, presence of viral mutation(s) within the areas targeted by this assay, and inadequate number of  viral copies (<250 copies / mL). A negative result must be combined with clinical observations, patient history, and epidemiological information. Fact Sheet for Patients:   BoilerBrush.com.cy Fact Sheet for Healthcare Providers: https://pope.com/ This test is not yet approved or cleared  by the [place] FDA and has been authorized for detection and/or diagnosis of SARS-CoV-2 by FDA under an Emergency Use Authorization (EUA).  This EUA will remain in effect (meaning  this test can be used) for the duration of the COVID-19 declaration under Section 564(b)(1) of the Act, 21 U.S.C. section 360bbb-3(b)(1), unless the authorization is terminated or revoked sooner. Performed at [facility], [address], [place], [place] [address]   CBC with Differential     Status: Abnormal   Collection Time: 02/10/20  2:52 PM  Result Value Ref Range   WBC 6.9 4.0 - 10.5 K/uL   RBC 3.73 (L) 3.87 - 5.11 MIL/uL   Hemoglobin 11.4 (L) 12.0 - 15.0 g/dL   HCT 55.9 (L) 74.1 - 63.8 %   MCV 92.0 80.0 - 100.0 fL   MCH 30.6 26.0 - 34.0 pg   MCHC 33.2 30.0 - 36.0 g/dL   RDW 45.3 64.6 - 80.3 %   Platelets 204 150 - 400 K/uL   nRBC 0.0 0.0 - 0.2 %   Neutrophils Relative % 64 %   Neutro Abs 4.4 1.7 - 7.7 K/uL   Lymphocytes Relative 27 %   Lymphs Abs 1.9 0.7 - 4.0 K/uL   Monocytes Relative 7 %   Monocytes Absolute 0.5 0.1 - 1.0 K/uL   Eosinophils Relative 1 %   Eosinophils Absolute 0.1 0.0 - 0.5 K/uL   Basophils Relative 1 %   Basophils Absolute 0.1 0.0 - 0.1 K/uL   Immature Granulocytes 0 %   Abs Immature Granulocytes 0.03 0.00 - 0.07 K/uL    Comment: Performed at [facility], [address], [place], [place] [address]  Comprehensive metabolic panel     Status: Abnormal   Collection Time: 02/10/20  2:52 PM  Result Value Ref Range   Sodium 139 135 - 145 mmol/L   Potassium 4.2 3.5 - 5.1 mmol/L   Chloride 105 98 - 111 mmol/L   CO2 23 22 - 32 mmol/L   Glucose, Bld 107 (H) 70 - 99 mg/dL    Comment: Glucose reference range applies only to samples taken after fasting for at least 8 hours.   BUN 35 (H) 8 - 23 mg/dL   Creatinine, Ser 8.25 0.44 - 1.00 mg/dL   Calcium 9.4 8.9 - 00.3 mg/dL   Total Protein 6.3 (L) 6.5 - 8.1 g/dL   Albumin 3.9 3.5 - 5.0 g/dL   AST 23 15 - 41 U/L   ALT 22 0 - 44 U/L   Alkaline Phosphatase 34 (L) 38 - 126 U/L   Total Bilirubin 0.9 0.3 - 1.2 mg/dL   GFR calc non Af Amer >60 >60 mL/min   GFR calc Af Amer >60 >60 mL/min   Anion gap  11 5 - 15    Comment: Performed at [facility], [address], [place], [place] [address]  Protime-INR     Status: None   Collection Time: 02/10/20  2:52 PM  Result Value Ref Range   Prothrombin Time 13.2 11.4 - 15.2 seconds   INR 1.0 0.8 - 1.2    Comment: (NOTE) INR goal varies based on device and disease states. Performed at [facility], [address] [place], [place] [address]   Lipase, blood     Status: None   Collection Time: 02/10/20  2:52 PM  Result Value Ref Range   Lipase 27 11 - 51 U/L    Comment: Performed at [facility], [place] [address], [place], [place] [address]  Troponin I (High Sensitivity)     Status: None   Collection Time: 02/10/20  2:52 PM  Result Value Ref Range   Troponin I (High Sensitivity) 2 <18 ng/L    Comment: (NOTE) Elevated high sensitivity troponin I (hsTnI) values and significant  changes across serial measurements may suggest ACS but many other  chronic and acute conditions are known to elevate hsTnI results.  Refer to the "Links" section for chest pain algorithms and additional  guidance. Performed at [facility], [place] [address], [place], [place] [address]   Lactic acid, plasma     Status: Abnormal   Collection Time: 02/10/20  2:53 PM  Result Value Ref Range   Lactic Acid, Venous 2.6 (HH) 0.5 - 1.9 mmol/L    Comment: CRITICAL RESULT CALLED TO, READ BACK BY AND VERIFIED WITH: M.COCHRANE RN 1093 02/10/20 MCCORMICK K Performed at [facility] [address], [place], [place] [address]   Type and screen [place]     Status: None   Collection Time: 02/10/20  2:54 PM  Result Value Ref Range   ABO/RH(D) A POS    Antibody Screen NEG    Sample Expiration      02/13/2020,2359 Performed at [facility], [place] [address], [place], [place] [address]   ABO/Rh     Status: None   Collection Time: 02/10/20  2:54 PM  Result Value Ref Range   ABO/RH(D)      A POS Performed at [facility] [address], [place], [place] [address]   Urinalysis, Routine w reflex microscopic     Status: Abnormal   Collection Time: 02/10/20  3:35 PM  Result Value Ref Range   Color, Urine COLORLESS (A) YELLOW   APPearance CLEAR CLEAR   Specific Gravity, Urine 1.006 1.005 - 1.030   pH 5.0 5.0 - 8.0   Glucose, UA NEGATIVE NEGATIVE mg/dL   Hgb urine dipstick SMALL (A) NEGATIVE   Bilirubin Urine NEGATIVE NEGATIVE   Ketones, ur 5 (A) NEGATIVE mg/dL   Protein, ur NEGATIVE NEGATIVE mg/dL   Nitrite NEGATIVE NEGATIVE   Leukocytes,Ua NEGATIVE NEGATIVE   RBC / HPF 0-5 0 - 5 RBC/hpf   Bacteria, UA NONE SEEN NONE SEEN   Squamous Epithelial / LPF 0-5 0 - 5    Comment: Performed at [facility], [place] [address] [place], [place] [address]  POC occult blood, ED     Status: Abnormal   Collection Time: 02/10/20  3:42 PM  Result Value Ref Range   Fecal Occult Bld POSITIVE (A) NEGATIVE  Lactic acid, plasma     Status: None   Collection Time: 02/10/20  4:38 PM  Result Value Ref Range   Lactic Acid, Venous 1.4 0.5 - 1.9 mmol/L    Comment: Performed at [facility] [address] [place], [place] [address]  Troponin I (High Sensitivity)     Status: None   Collection Time: 02/10/20  4:38 PM  Result Value Ref Range   Troponin I (High Sensitivity) 3 <18 ng/L    Comment: (NOTE) Elevated high sensitivity troponin I (hsTnI) values and significant  changes across serial measurements may suggest ACS but many other  chronic and acute conditions are known to elevate hsTnI results.  Refer to the "Links" section for chest pain algorithms and additional  guidance. Performed at [facility], [address] [place], [place] [address]   HIV Antibody (routine testing w rflx)     Status: None   Collection Time: 02/10/20  4:38 PM  Result Value Ref Range   HIV Screen 4th Generation wRfx Non Reactive Non Reactive    Comment: Performed at [facility], [address] [place],  [place] [address]  Hemoglobin and hematocrit, blood     Status: Abnormal   Collection Time: 02/10/20  4:38 PM  Result Value Ref Range   Hemoglobin 10.8 (L) 12.0 - 15.0 g/dL   HCT 51.7 (L) 61.6 - 07.3 %    Comment: Performed at [facility], [address] [place], [place] [address]  Hemoglobin and hematocrit, blood     Status: Abnormal   Collection Time: 02/10/20  8:13 PM  Result Value Ref Range   Hemoglobin 9.6 (L) 12.0 - 15.0 g/dL   HCT 69.4 (L) 85.4 - 62.7 %    Comment: Performed at [facility], [address], [place], [place] [address]   DG CHEST PORT 1 VIEW  Result Date: 02/10/2020 CLINICAL DATA:  Gastrointestinal bleeding EXAM: PORTABLE CHEST 1 VIEW COMPARISON:  05/24/2019 FINDINGS: Single frontal view of the chest demonstrates an unremarkable cardiac silhouette. No airspace disease, effusion, or pneumothorax. No acute bony abnormality. IMPRESSION: 1. No acute intrathoracic process. Electronically Signed   By: Sharlet Salina M.D.   On: 02/10/2020 17:36    Pending Labs Unresulted Labs (From admission, onward)    Start     Ordered   02/11/20 0500  CBC  Tomorrow morning,   R     02/10/20 1633   02/11/20 0500  Basic metabolic panel  Tomorrow morning,   R     02/10/20 1633   02/10/20 1433  Urine culture  ONCE - STAT,   STAT     02/10/20 1432          Vitals/Pain Today's Vitals   02/10/20 2013 02/10/20 2014 02/10/20 2106 02/10/20 2222  BP:  113/72    Pulse:  91    Resp:  20    Temp:      TempSrc:      SpO2:  100%    Weight:      Height:      PainSc: 0-No pain  0-No pain 0-No pain    Isolation Precautions No active isolations  Medications Medications  pantoprazole (PROTONIX) 80 mg in sodium chloride 0.9 % 100 mL (0.8 mg/mL) infusion (8 mg/hr Intravenous New Bag/Given 02/10/20 2105)  pantoprazole (PROTONIX) injection 40 mg (has no administration in time range)  sodium chloride flush (NS) 0.9 % injection 3 mL (has no administration in time range)  0.9 %   sodium chloride infusion (1,000 mLs Intravenous New Bag/Given 02/10/20 2012)  acetaminophen (TYLENOL) tablet 650 mg (has no administration in time range)    Or  acetaminophen (TYLENOL) suppository 650 mg (has no administration in time range)  ondansetron (ZOFRAN) tablet 4 mg (has no administration in time range)    Or  ondansetron (ZOFRAN) injection 4 mg (has no administration in time range)  pantoprazole (PROTONIX) injection 40 mg (40 mg Intravenous Given 02/10/20 1534)    Mobility walks with person assist High fall risk  Focused Assessments Cardiac Assessment Handoff:  Cardiac Rhythm: Sinus tachycardia No results found for: CKTOTAL, CKMB, CKMBINDEX, TROPONINI No results found for: DDIMER Does the Patient currently have chest pain? No      R Recommendations: See Admitting Provider Note  Report given to:   Additional Notes:  Patient is A&OX 4 but feeling a little weak; Pt has had a 1 point drop in Hemoglobin. Next Hemoglobin is due to be drawn at 2330 tonight; MD is holding Metoprolol for now but patient does c/o occasional PVCs. Patient aware of hold for home meds at this time-Monique,RN

## 2020-02-10 NOTE — Consult Note (Addendum)
[facility] Consult: 4:14 PM 02/10/2020  LOS: 0 days    Referring Provider: Dr Marylou Mccoy in ED  Primary Care Physician:  Tisovec, Adelfa Koh, MD Primary Gastroenterologist: A Dr. In [place], [facility]    Reason for Consultation:     HPI: Jennifer Baxter is a 69 y.o. female.  PMH hypertension.  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  Osteoarthritis.  Cholecystectomy.  Total abdominal hysterectomy. Previous Dielafoy's related GI bleeds.  First was many years ago in [place].  2nd was in [place] where Dr Fayrene Fearing performed EGD Undergoes regular colonoscopy for hx polyps and family history of colon cancer in her brother.  The last colonoscopy was 2 years ago by a doctor in [place], [place].  She had some "benign" polyps. Does not take PPI as she does not have GERD symptoms.  Normally has a good appetite and has daily bowel movements right  7 AM this morning she awoke with a sense of lightheadedness.  She took her blood pressure, systolic reading 557.  She had worked in the yard yesterday and thought maybe she was dehydrated so she drank a lot of water but she remained dizzy and then got presyncopal.  She laid down.  She then proceeded to have a watery black stool.  1 was at home, 1 was on the way by ambulance to the ED and she has had 2 in the emergency department.  No nausea, vomiting, abdominal pain though on physical exam the epigastrium is tender. She takes 81 mg aspirin 3 times a week.  Takes Aleve 500 mg, 1 pill 3 times a week for arthritis.  BP soft 90s to low 100s/60s to 70s.  Heart rate 90s to low 1 teens.  Hb 11.4 (14.5 in 04/2019), MCV 92.  Elevated BUN at 35, elcreatinine normal.  Evated lactate at 2.6. Protonix drip initiated.  Lives with her husband.  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  Moved around a lot because her husband was in the [employer]. Does not drink alcohol.  Past Medical History:  Diagnosis Date  . Arrhythmia   . Arthritis   . Degenerative joint disease (DJD) of lumbar spine   . DJD (degenerative joint disease)   . GI bleed   . HOCM (hypertrophic obstructive cardiomyopathy) (HCC)   . Hypertension   . Obesity   . PVC's (premature ventricular contractions)     Past Surgical History:  Procedure Laterality Date  . ABDOMINAL HYSTERECTOMY    . CERVICAL DISCECTOMY    . CERVICAL FUSION    . CHOLECYSTECTOMY    . LUMBAR DISC SURGERY    . MENISCECTOMY    . THORACIC DISCECTOMY      Prior to Admission medications   Medication Sig Start Date End Date Taking? Authorizing Provider  amLODipine (NORVASC) 5 MG tablet Take 2.5 mg by mouth daily.    Yes [provider]  chlorthalidone (HYGROTON) 25 MG tablet Take 25 mg by mouth See admin instructions. Takes 25  mg 2 times a week.   Yes [provider]  Cholecalciferol (VITAMIN D) 125 MCG (5000 UT) CAPS Take 5,000 Units by mouth See admin instructions. Takes 5000 units by mouth 3 times a week (Monday,Wednesday and Friday)   Yes [provider]  metoprolol succinate (TOPROL-XL) 100 MG 24 hr tablet Take 100 mg by mouth 2 (two) times daily. Take with or immediately following a meal.   Yes [provider]  naproxen (NAPROSYN) 500 MG tablet Take 500 mg by mouth as needed for moderate pain.    Yes [provider]  potassium chloride (MICRO-K) 10 MEQ CR capsule Take 10 mEq by mouth See admin instructions. Takes 10 mEq by mouth 2 times a week. 01/05/20  Yes [provider]  diltiazem (CARDIZEM CD) 120 MG 24 hr capsule Take 1 capsule (120 mg total) by mouth daily. Patient not taking: Reported on 02/10/2020 06/03/19   Regan Lemming, MD    Scheduled Meds:  Infusions:  PRN Meds:    Allergies as of 02/10/2020 -  Review Complete 02/10/2020  Allergen Reaction Noted  . Codeine Hives, Itching, and Rash 04/10/2012  . Morphine Hives, Itching, and Other (See Comments) 04/10/2012    Family History  Problem Relation Age of Onset  . Arthritis Mother   . Diabetes Mellitus I Mother   . Hypertension Mother   . Arthritis Father   . Hypertension Father   . Arthritis Sister   . Hypertension Sister   . Stroke Sister   . Alcoholism Brother   . Colon cancer Brother   . Hypertension Brother   . Diabetes Mellitus I Maternal Grandmother   . Heart disease Paternal Grandmother   . Hypertension Paternal Grandfather   . Stroke Paternal Grandfather   . Breast cancer Neg Hx     Social History   Socioeconomic History  . Marital status: Married    Spouse name: Not on file  . Number of children: Not on file  . Years of education: Not on file  . Highest education level: Not on file  Occupational History  . Not on file  Tobacco Use  . Smoking status: Never Smoker  . Smokeless tobacco: Never Used  Substance and Sexual Activity  . Alcohol use: No  . Drug use: No  . Sexual activity: Not on file  Other Topics Concern  . Not on file  Social History Narrative  . Not on file   Social Determinants of Health   Financial Resource Strain:   . Difficulty of Paying Living Expenses:   Food Insecurity:   . Worried About Programme researcher, broadcasting/film/video in the Last Year:   . Barista in the Last Year:   Transportation Needs:   . Freight forwarder (Medical):   Marland Kitchen Lack of Transportation (Non-Medical):   Physical Activity:   . Days of Exercise per Week:   . Minutes of Exercise per Session:   Stress:   . Feeling of Stress :   Social Connections:   . Frequency of Communication with Friends and Family:   . Frequency of Social Gatherings with Friends and Family:   . Attends Religious Services:   . Active Member of Clubs or Organizations:   . Attends Banker Meetings:   Marland Kitchen Marital Status:   Intimate  Partner Violence:   . Fear of Current or Ex-Partner:   . Emotionally Abused:   Marland Kitchen Physically Abused:   . Sexually Abused:     REVIEW  OF SYSTEMS: Constitutional: Weakness, dizziness. ENT:  No nose bleeds Pulm: No shortness of breath.  No cough. CV: Some sense of tachycardia but no arrhythmia.  No chest pain no LE edema.  GU:  No hematuria, no frequency GI: See HPI. Heme: Normally has no issues with unusual or excessive bleeding. Transfusions: When she has had Dula Foy's in the past she is required blood transfusion. Neuro: Presyncope.  No LOC. Derm:  No itching, no rash or sores.  Endocrine:  No sweats or chills.  No polyuria or dysuria Immunization: Has not been vaccinated for Covid.  She has moral issues.  She believes that the vaccines have been manufactured and developed using fetal cells from aborted fetuses, as a Catholic she is opposed to this. Travel:  None beyond local counties in last few months.    PHYSICAL EXAM: Vital signs in last 24 hours: Vitals:   02/10/20 1515 02/10/20 1545  BP: 102/73 100/76  Pulse: 90 (!) 111  Resp: 13 20  Temp:    SpO2: 99% 99%   Wt Readings from Last 3 Encounters:  02/10/20 81.6 kg  06/03/19 109 kg  12/31/17 112.9 kg    General: Obese, pale, alert, comfortable Head: No facial asymmetry or swelling.  No signs of head trauma. Eyes: No conjunctival pallor.  No scleral icterus.  EOMI. Ears: Not hard of hearing Nose: No congestion or discharge Mouth: Tongue midline.  Good dentition.  Moist, pink, clear mucosa. Neck: No JVD, thyromegaly or masses Lungs: Clear bilaterally.  No labored breathing. Heart: Regular.  Slightly tacky at about 112.  No MRG.  S1, S2 present Abdomen: Obese.  Soft.  No HSM, masses, bruits, hernias.  Very focal mild to moderate tenderness way up in the apex of the epigastrium..   Rectal: Deferred.  Stool sent to the lab is FOBT + Musc/Skeltl: No joint redness, swelling or gross deformities. Extremities: No  CCE. Neurologic: Alert.  Oriented x3.  No gross weakness or deficits.  No tremors. Skin: No purpura, no rash, no suspicious lesions Nodes: No cervical adenopathy Psych: Cooperative, pleasant, calm.  Fluid speech.  Excellent historian.  Intake/Output from previous day: No intake/output data recorded. Intake/Output this shift: No intake/output data recorded.  LAB RESULTS: Recent Labs    02/10/20 1452  WBC 6.9  HGB 11.4*  HCT 34.3*  PLT 204   BMET Lab Results  Component Value Date   NA 139 02/10/2020   NA 138 05/24/2019   K 4.2 02/10/2020   K 2.8 (L) 05/24/2019   CL 105 02/10/2020   CL 101 05/24/2019   CO2 23 02/10/2020   CO2 20 (L) 05/24/2019   GLUCOSE 107 (H) 02/10/2020   GLUCOSE 118 (H) 05/24/2019   BUN 35 (H) 02/10/2020   BUN 20 05/24/2019   CREATININE 0.89 02/10/2020   CREATININE 1.10 (H) 05/24/2019   CALCIUM 9.4 02/10/2020   CALCIUM 9.8 05/24/2019   LFT Recent Labs    02/10/20 1452  PROT 6.3*  ALBUMIN 3.9  AST 23  ALT 22  ALKPHOS 34*  BILITOT 0.9   PT/INR Lab Results  Component Value Date   INR 1.0 02/10/2020   Hepatitis Panel No results for input(s): HEPBSAG, HCVAB, HEPAIGM, HEPBIGM in the last 72 hours. C-Diff No components found for: CDIFF Lipase     Component Value Date/Time   LIPASE 27 02/10/2020 1452     RADIOLOGY STUDIES: No results found.   IMPRESSION:   *    GI bleed.  Suspect upper.    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       PLAN:     *    Agree with allowing clears but n.p.o. after midnight so that she will be ready for EGD tomorrow, timing TBD.  *    Leave IV Protonix drip in place.   Azucena Freed  02/10/2020, 4:14 PM Phone [phone]  ________________________________________________________________________  Velora Heckler GI MD note:  I personally examined the patient, reviewed the data and agree with the assessment and plan described above.  She's obviously having UGI bleeding, painless. Most  likely this is another Dieulafoy lesion of the stomach, however she does take alleve 3 times weekly and so perhaps PUD.  I don't think she is actively bleeding right now. We are planning EGD tomorrow, currently on schedule for 11AM.   Owens Loffler, MD [facility] Pager [phone]

## 2020-02-10 NOTE — Consult Note (Signed)
Pt cleaned of incontinent diarrhea husband at bedside

## 2020-02-10 NOTE — H&P (Addendum)
History and Physical    Jennifer Baxter EHU:[id number] DOB: [birth year] DOA: 02/10/2020  Referring MD/NP/PA: Lynden Oxford, MD PCP: Gaspar Garbe, MD  Patient coming from: Home  Chief Complaint:  Dark stools  I have personally briefly reviewed patient's old medical records in [facility]   HPI: Jennifer Baxter is a 69 y.o. 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  She woke up around 7 AM this morning and felt lightheaded.  She started drinking a lot of fluids and when she went to use the bathroom almost passed out.  Thereafter she laid in the bed to see if symptoms would prove.  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  They had tried to check orthostatic vital signs but were unable.  With changes in position from laying to sitting her heart rates would be significantly elevated and systolic blood pressures noted to be in the 90s.  At some point her stomach started rumbling and when she went to use the bathroom had a dark tarry stool.  Since onset of symptoms she has had a total of 4 dark tarry stools.  Notes associated symptoms of epigastric abdominal discomfort.  She has had symptoms like this previously in the past related to  [facility] vascular malformations causing GI bleeds occurring initially in 2000/2001 and second episode in 2013.  Patient admits that she uses naproxen 2-3 times per week for arthritis.    ED Course: Upon admission into the emergency department patient was noted to be afebrile with a pulse up to 111, blood pressures 95/75-130/84, and all other vital signs maintained.  Labs significant for hemoglobin 11.4, BUN 35, creatinine 0.89, and lactic acid 2.9.  Stool guaiacs were noted to be positive.  Patient was typed and screened for possible need blood products and given 40 mg of Protonix IV.  [facility] consulted.  [facility]  called to admit.  Review of Systems  Constitutional: Positive for malaise/fatigue. Negative for fever.  HENT: Negative for congestion and nosebleeds.   Eyes: Negative for photophobia and discharge.  Respiratory: Negative for cough and shortness of breath.   Cardiovascular: Negative for chest pain and leg swelling.  Gastrointestinal: Positive for abdominal pain and melena. Negative for nausea and vomiting.  Genitourinary: Negative for dysuria and hematuria.  Musculoskeletal: Positive for joint pain. Negative for falls.  Skin: Negative for rash.  Neurological: Positive for dizziness. Negative for loss of consciousness.  Endo/Heme/Allergies: Does not bruise/bleed easily.  Psychiatric/Behavioral: Negative for substance abuse.    Past Medical History:  Diagnosis Date  . Arrhythmia   . Arthritis   . Degenerative joint disease (DJD) of lumbar spine   . DJD (degenerative joint disease)   . GI bleed   . HOCM (hypertrophic obstructive cardiomyopathy) (HCC)   . Hypertension   . Obesity   . PVC's (premature ventricular contractions)     Past Surgical History:  Procedure Laterality Date  . ABDOMINAL HYSTERECTOMY    . CERVICAL DISCECTOMY    . CERVICAL FUSION    . CHOLECYSTECTOMY    . LUMBAR DISC SURGERY    . MENISCECTOMY    . THORACIC DISCECTOMY       reports that she has never smoked. She has never used smokeless tobacco. She reports that she does not drink alcohol or use drugs.  Allergies  Allergen Reactions  . Codeine Hives, Itching and Rash  . Morphine  Hives, Itching and Other (See Comments)    Questionable Arm swelled     Family History  Problem Relation Age of Onset  . Arthritis Mother   . Diabetes Mellitus I Mother   . Hypertension Mother   . Arthritis Father   . Hypertension Father   . Arthritis Sister   . Hypertension Sister   . Stroke Sister   . Alcoholism Brother   . Colon cancer Brother   . Hypertension Brother   . Diabetes Mellitus I Maternal  Grandmother   . Heart disease Paternal Grandmother   . Hypertension Paternal Grandfather   . Stroke Paternal Grandfather   . Breast cancer Neg Hx     Prior to Admission medications   Medication Sig Start Date End Date Taking? Authorizing Provider  amLODipine (NORVASC) 5 MG tablet Take 2.5 mg by mouth daily.    Yes [provider]  chlorthalidone (HYGROTON) 25 MG tablet Take 25 mg by mouth See admin instructions. Takes 25 mg 2 times a week.   Yes [provider]  Cholecalciferol (VITAMIN D) 125 MCG (5000 UT) CAPS Take 5,000 Units by mouth See admin instructions. Takes 5000 units by mouth 3 times a week (Monday,Wednesday and Friday)   Yes [provider]  metoprolol succinate (TOPROL-XL) 100 MG 24 hr tablet Take 100 mg by mouth 2 (two) times daily. Take with or immediately following a meal.   Yes [provider]  naproxen (NAPROSYN) 500 MG tablet Take 500 mg by mouth as needed for moderate pain.    Yes [provider]  potassium chloride (MICRO-K) 10 MEQ CR capsule Take 10 mEq by mouth See admin instructions. Takes 10 mEq by mouth 2 times a week. 01/05/20  Yes [provider]  diltiazem (CARDIZEM CD) 120 MG 24 hr capsule Take 1 capsule (120 mg total) by mouth daily. Patient not taking: Reported on 02/10/2020 06/03/19   Regan Lemming, MD    Physical Exam:  Constitutional: Elderly female NAD, calm, comfortable Vitals:   02/10/20 1402 02/10/20 1515 02/10/20 1545 02/10/20 1600  BP:  102/73 100/76 95/75  Pulse:  90 (!) 111 92  Resp:  13 20 11   Temp:      TempSrc:      SpO2:  99% 99% 100%  Weight: 81.6 kg     Height: 5\' 6"  (1.676 m)      Eyes: PERRL, lids and conjunctivae normal ENMT: Mucous membranes are dry. Posterior pharynx clear of any exudate or lesions.  Neck: normal, supple, no masses, no thyromegaly Respiratory: clear to auscultation bilaterally, no wheezing, no crackles. Normal respiratory effort. No accessory muscle use.    Cardiovascular: Regular rate and rhythm, no murmurs / rubs / gallops. No extremity edema. 2+ pedal pulses. No carotid bruits.  Heart rates noted to go up into the 140s upon sitting up to check respirations. Abdomen: Mild epigastric tenderness, no masses palpated. No hepatosplenomegaly. Bowel sounds positive.   Musculoskeletal: no clubbing / cyanosis. No joint deformity upper and lower extremities. Good ROM, no contractures. Normal muscle tone.  Skin: Pallor present.  Healed surgical scar of the thoracic spine. Neurologic: CN 2-12 grossly intact.  Able to move all extremities. Psychiatric: Normal judgment and insight. Alert and oriented x 3. Normal mood.     Labs on Admission: I have personally reviewed following labs and imaging studies  CBC: Recent Labs  Lab 02/10/20 1452  WBC 6.9  NEUTROABS 4.4  HGB 11.4*  HCT 34.3*  MCV 92.0  PLT 204   Basic Metabolic Panel: Recent Labs  Lab 02/10/20 1452  NA 139  K 4.2  CL 105  CO2 23  GLUCOSE 107*  BUN 35*  CREATININE 0.89  CALCIUM 9.4   GFR: Estimated Creatinine Clearance: 64.2 mL/min (by C-G formula based on SCr of 0.89 mg/dL). Liver Function Tests: Recent Labs  Lab 02/10/20 1452  AST 23  ALT 22  ALKPHOS 34*  BILITOT 0.9  PROT 6.3*  ALBUMIN 3.9   Recent Labs  Lab 02/10/20 1452  LIPASE 27   No results for input(s): AMMONIA in the last 168 hours. Coagulation Profile: Recent Labs  Lab 02/10/20 1452  INR 1.0   Cardiac Enzymes: No results for input(s): CKTOTAL, CKMB, CKMBINDEX, TROPONINI in the last 168 hours. BNP (last 3 results) No results for input(s): PROBNP in the last 8760 hours. HbA1C: No results for input(s): HGBA1C in the last 72 hours. CBG: No results for input(s): GLUCAP in the last 168 hours. Lipid Profile: No results for input(s): CHOL, HDL, LDLCALC, TRIG, CHOLHDL, LDLDIRECT in the last 72 hours. Thyroid Function Tests: No results for input(s): TSH, T4TOTAL, FREET4, T3FREE, THYROIDAB in the last  72 hours. Anemia Panel: No results for input(s): VITAMINB12, FOLATE, FERRITIN, TIBC, IRON, RETICCTPCT in the last 72 hours. Urine analysis:    Component Value Date/Time   COLORURINE COLORLESS (A) 02/10/2020 1535   APPEARANCEUR CLEAR 02/10/2020 1535   LABSPEC 1.006 02/10/2020 1535   PHURINE 5.0 02/10/2020 1535   GLUCOSEU NEGATIVE 02/10/2020 1535   HGBUR SMALL (A) 02/10/2020 1535   BILIRUBINUR NEGATIVE 02/10/2020 1535   KETONESUR 5 (A) 02/10/2020 1535   PROTEINUR NEGATIVE 02/10/2020 1535   NITRITE NEGATIVE 02/10/2020 1535   LEUKOCYTESUR NEGATIVE 02/10/2020 1535   Sepsis Labs: Recent Results (from the past 240 hour(s))  SARS Coronavirus 2 by RT PCR (hospital order, performed in [facility] hospital lab) Nasopharyngeal Nasopharyngeal Swab     Status: None   Collection Time: 02/10/20  2:33 PM   Specimen: Nasopharyngeal Swab  Result Value Ref Range Status   SARS Coronavirus 2 NEGATIVE NEGATIVE Final    Comment: (NOTE) SARS-CoV-2 target nucleic acids are NOT DETECTED. The SARS-CoV-2 RNA is generally detectable in upper and lower respiratory specimens during the acute phase of infection. The lowest concentration of SARS-CoV-2 viral copies this assay can detect is 250 copies / mL. A negative result does not preclude SARS-CoV-2 infection and should not be used as the sole basis for treatment or other patient management decisions.  A negative result may occur with improper specimen collection / handling, submission of specimen other than nasopharyngeal swab, presence of viral mutation(s) within the areas targeted by this assay, and inadequate number of viral copies (<250 copies / mL). A negative result must be combined with clinical observations, patient history, and epidemiological information. Fact Sheet for Patients:   BoilerBrush.com.cy Fact Sheet for Healthcare Providers: https://pope.com/ This test is not yet approved or cleared  by  the [place] FDA and has been authorized for detection and/or diagnosis of SARS-CoV-2 by FDA under an Emergency Use Authorization (EUA).  This EUA will remain in effect (meaning this test can be used) for the duration of the COVID-19 declaration under Section 564(b)(1) of the Act, 21 U.S.C. section 360bbb-3(b)(1), unless the authorization is terminated or revoked sooner. Performed at [facility], [address], [place], [place] [address]      Radiological Exams on Admission: No results found.  EKG: Independently reviewed.  Sinus rhythm at  95  Assessment/Plan  Acute blood loss anemia Suspected upper GI bleed: Acute.  Patient presented with complaints of dark stools.  Hemoglobin 11.4 g/dL.  Hemoglobin prior had been around 14.5 in 04/2019.  Patient had to have elevated BUN of 35 given concern for upper GI bleed.  She was typed and screened for possible need of blood products.  -Admit to a progressive bed -Clear liquid diet and n.p.o. after midnight for EGD in a.m. -Protonix drip -Serial monitoring of H&H -Transfuse blood products as needed   -Appreciate GI consultative services, will follow for further recommendation  Lactic acidosis: Acute.  Initial lactic acid elevated 2.6.  Suspect secondary to hypovolemia. -IV fluids -Continue to trend lactic acid level  Essential hypertension: Blood pressures currently low normal.  Home blood pressure medications include amlodipine 2.5 mg daily, chlorthalidone 25 mg 2 times a week, and metoprolol 100 mg twice daily. -Hold home blood pressure medications at this time -Restart when medically appropriate   Arthritis: Home medications include naproxen 500 mg as needed for arthritis pains. -Hold naproxen  DVT prophylaxis: SCDs Code Status: Full Family Communication: Husband updated at bedside Disposition Plan: Possible discharge home in 1 day Consults called: GI Admission status: Observation  Norval Morton MD [facility] Pager [phone]   If 7PM-7AM, please contact night-coverage [email or web address] Password [facility]  02/10/2020, 4:27 PM

## 2020-02-11 ENCOUNTER — Observation Stay (HOSPITAL_COMMUNITY): Payer: Medicare Other | Admitting: Anesthesiology

## 2020-02-11 ENCOUNTER — Encounter (HOSPITAL_COMMUNITY): Payer: Self-pay | Admitting: Internal Medicine

## 2020-02-11 ENCOUNTER — Encounter (HOSPITAL_COMMUNITY)
Admission: EM | Disposition: A | Discharge status: Home / Self Care | Payer: Self-pay | Source: Home / Self Care | Attending: Internal Medicine

## 2020-02-11 ENCOUNTER — Other Ambulatory Visit: Payer: Self-pay | Admitting: Physician Assistant

## 2020-02-11 DIAGNOSIS — Z8261 Family history of arthritis: Secondary | ICD-10-CM | POA: Diagnosis not present

## 2020-02-11 DIAGNOSIS — Z791 Long term (current) use of non-steroidal anti-inflammatories (NSAID): Secondary | ICD-10-CM | POA: Diagnosis not present

## 2020-02-11 DIAGNOSIS — E669 Obesity, unspecified: Secondary | ICD-10-CM | POA: Diagnosis present

## 2020-02-11 DIAGNOSIS — Z20822 Contact with and (suspected) exposure to covid-19: Secondary | ICD-10-CM | POA: Diagnosis present

## 2020-02-11 DIAGNOSIS — K259 Gastric ulcer, unspecified as acute or chronic, without hemorrhage or perforation: Secondary | ICD-10-CM | POA: Diagnosis not present

## 2020-02-11 DIAGNOSIS — K254 Chronic or unspecified gastric ulcer with hemorrhage: Secondary | ICD-10-CM | POA: Diagnosis present

## 2020-02-11 DIAGNOSIS — R197 Diarrhea, unspecified: Secondary | ICD-10-CM | POA: Diagnosis present

## 2020-02-11 DIAGNOSIS — R944 Abnormal results of kidney function studies: Secondary | ICD-10-CM | POA: Diagnosis present

## 2020-02-11 DIAGNOSIS — D62 Acute posthemorrhagic anemia: Secondary | ICD-10-CM | POA: Diagnosis present

## 2020-02-11 DIAGNOSIS — I421 Obstructive hypertrophic cardiomyopathy: Secondary | ICD-10-CM | POA: Diagnosis present

## 2020-02-11 DIAGNOSIS — Z833 Family history of diabetes mellitus: Secondary | ICD-10-CM | POA: Diagnosis not present

## 2020-02-11 DIAGNOSIS — Z8719 Personal history of other diseases of the digestive system: Secondary | ICD-10-CM | POA: Diagnosis not present

## 2020-02-11 DIAGNOSIS — Z8249 Family history of ischemic heart disease and other diseases of the circulatory system: Secondary | ICD-10-CM | POA: Diagnosis not present

## 2020-02-11 DIAGNOSIS — K921 Melena: Secondary | ICD-10-CM | POA: Diagnosis not present

## 2020-02-11 DIAGNOSIS — Z800 Family history of malignant neoplasm of digestive organs: Secondary | ICD-10-CM | POA: Diagnosis not present

## 2020-02-11 DIAGNOSIS — Z90710 Acquired absence of both cervix and uterus: Secondary | ICD-10-CM | POA: Diagnosis not present

## 2020-02-11 DIAGNOSIS — Z8774 Personal history of (corrected) congenital malformations of heart and circulatory system: Secondary | ICD-10-CM | POA: Diagnosis not present

## 2020-02-11 DIAGNOSIS — Z79899 Other long term (current) drug therapy: Secondary | ICD-10-CM | POA: Diagnosis not present

## 2020-02-11 DIAGNOSIS — Z823 Family history of stroke: Secondary | ICD-10-CM | POA: Diagnosis not present

## 2020-02-11 DIAGNOSIS — Z981 Arthrodesis status: Secondary | ICD-10-CM | POA: Diagnosis not present

## 2020-02-11 DIAGNOSIS — M47816 Spondylosis without myelopathy or radiculopathy, lumbar region: Secondary | ICD-10-CM | POA: Diagnosis present

## 2020-02-11 DIAGNOSIS — E872 Acidosis: Secondary | ICD-10-CM | POA: Diagnosis present

## 2020-02-11 DIAGNOSIS — I951 Orthostatic hypotension: Secondary | ICD-10-CM | POA: Diagnosis present

## 2020-02-11 DIAGNOSIS — Z6829 Body mass index (BMI) 29.0-29.9, adult: Secondary | ICD-10-CM | POA: Diagnosis not present

## 2020-02-11 DIAGNOSIS — K922 Gastrointestinal hemorrhage, unspecified: Secondary | ICD-10-CM | POA: Diagnosis present

## 2020-02-11 DIAGNOSIS — Z885 Allergy status to narcotic agent status: Secondary | ICD-10-CM | POA: Diagnosis not present

## 2020-02-11 DIAGNOSIS — M1990 Unspecified osteoarthritis, unspecified site: Secondary | ICD-10-CM | POA: Diagnosis not present

## 2020-02-11 DIAGNOSIS — I10 Essential (primary) hypertension: Secondary | ICD-10-CM | POA: Diagnosis present

## 2020-02-11 HISTORY — PX: ESOPHAGOGASTRODUODENOSCOPY (EGD) WITH PROPOFOL: SHX5813

## 2020-02-11 HISTORY — PX: BIOPSY: SHX5522

## 2020-02-11 LAB — URINE CULTURE: Culture: <10000 — AB

## 2020-02-11 LAB — CBC
HCT: 26.4 % — ABNORMAL LOW (ref 36.0–46.0)
HCT: 26.3 % — ABNORMAL LOW (ref 36.0–46.0)
Hemoglobin: 9 g/dL — ABNORMAL LOW (ref 12.0–15.0)
Hemoglobin: 8.8 g/dL — ABNORMAL LOW (ref 12.0–15.0)
MCH: 31 pg (ref 26.0–34.0)
MCH: 30.7 pg (ref 26.0–34.0)
MCHC: 34.1 g/dL (ref 30.0–36.0)
MCHC: 33.5 g/dL (ref 30.0–36.0)
MCV: 91 fL (ref 80.0–100.0)
MCV: 91.6 fL (ref 80.0–100.0)
Platelets: 173 10*3/uL (ref 150–400)
Platelets: 168 10*3/uL (ref 150–400)
RBC: 2.9 MIL/uL — ABNORMAL LOW (ref 3.87–5.11)
RBC: 2.87 MIL/uL — ABNORMAL LOW (ref 3.87–5.11)
RDW: 12.4 % (ref 11.5–15.5)
RDW: 12.8 % (ref 11.5–15.5)
WBC: 6 10*3/uL (ref 4.0–10.5)
WBC: 5.1 10*3/uL (ref 4.0–10.5)
nRBC: 0 % (ref 0.0–0.2)
nRBC: 0 % (ref 0.0–0.2)

## 2020-02-11 LAB — BASIC METABOLIC PANEL
Anion gap: 8 (ref 5–15)
BUN: 28 mg/dL — ABNORMAL HIGH (ref 8–23)
CO2: 24 mmol/L (ref 22–32)
Calcium: 8.9 mg/dL (ref 8.9–10.3)
Chloride: 109 mmol/L (ref 98–111)
Creatinine, Ser: 0.95 mg/dL (ref 0.44–1.00)
GFR calc Af Amer: >60 mL/min (ref >60)
GFR calc non Af Amer: >60 mL/min (ref >60)
Glucose, Bld: 96 mg/dL (ref 70–99)
Potassium: 4.1 mmol/L (ref 3.5–5.1)
Sodium: 141 mmol/L (ref 135–145)

## 2020-02-11 SURGERY — ESOPHAGOGASTRODUODENOSCOPY (EGD) WITH PROPOFOL
Anesthesia: Monitor Anesthesia Care

## 2020-02-11 MED ORDER — PANTOPRAZOLE SODIUM 40 MG PO TBEC
40.0000 mg | DELAYED_RELEASE_TABLET | Freq: Two times a day (BID) | ORAL | Status: DC
  Administered 2020-02-11 – 2020-02-12 (×2): 40 mg via ORAL
  Filled 2020-02-11 (×2): qty 1

## 2020-02-11 MED ORDER — PROPOFOL 500 MG/50ML IV EMUL
INTRAVENOUS | Status: DC | PRN
  Administered 2020-02-11: 100 ug/kg/min via INTRAVENOUS

## 2020-02-11 MED ORDER — PROPOFOL 10 MG/ML IV BOLUS
INTRAVENOUS | Status: DC | PRN
  Administered 2020-02-11: 20 mg via INTRAVENOUS
  Administered 2020-02-11: 30 mg via INTRAVENOUS

## 2020-02-11 SURGICAL SUPPLY — 15 items

## 2020-02-11 NOTE — Interval H&P Note (Signed)
History and Physical Interval Note:  02/11/2020 10:57 AM  Rufina Falco  has presented today for surgery, with the diagnosis of gi bleed w melena, hx dielafoys lesion and gi bleed.  The various methods of treatment have been discussed with the patient and family. After consideration of risks, benefits and other options for treatment, the patient has consented to  Procedure(s): ESOPHAGOGASTRODUODENOSCOPY (EGD) WITH PROPOFOL (N/A) as a surgical intervention.  The patient's history has been reviewed, patient examined, no change in status, stable for surgery.  I have reviewed the patient's chart and labs.  Questions were answered to the patient's satisfaction.     Rachael Fee

## 2020-02-11 NOTE — Progress Notes (Signed)
MEWS yellow at 0955 for Pulse and RR.  VS recheck resulted in green.

## 2020-02-11 NOTE — Transfer of Care (Signed)
Immediate Anesthesia Transfer of Care Note  Patient: Jennifer Baxter  Procedure(s) Performed: ESOPHAGOGASTRODUODENOSCOPY (EGD) WITH PROPOFOL (N/A ) BIOPSY  Patient Location: Endoscopy Unit  Anesthesia Type:MAC  Level of Consciousness: awake, alert  and oriented  Airway & Oxygen Therapy: Patient Spontanous Breathing and Patient connected to nasal cannula oxygen  Post-op Assessment: Report given to RN and Post -op Vital signs reviewed and stable  Post vital signs: Reviewed and stable  Last Vitals:  Vitals Value Taken Time  BP 93/49 02/11/20 1143  Temp 37.4 C 02/11/20 1139  Pulse 91 02/11/20 1143  Resp 11 02/11/20 1143  SpO2 100 % 02/11/20 1143  Vitals shown include unvalidated device data.  Last Pain:  Vitals:   02/11/20 1036  TempSrc: Oral  PainSc: 0-No pain         Complications: No apparent anesthesia complications

## 2020-02-11 NOTE — Anesthesia Preprocedure Evaluation (Signed)
Anesthesia Evaluation  Patient identified by MRN, date of birth, ID band Patient awake    Reviewed: Allergy & Precautions, H&P , NPO status , Patient's Chart, lab work & pertinent test results  Airway Mallampati: II   Neck ROM: full    Dental   Pulmonary neg pulmonary ROS,    breath sounds clear to auscultation       Cardiovascular hypertension,  Rhythm:regular Rate:Normal  HOCM   Neuro/Psych    GI/Hepatic   Endo/Other    Renal/GU      Musculoskeletal  (+) Arthritis ,   Abdominal   Peds  Hematology  (+) Blood dyscrasia, anemia ,   Anesthesia Other Findings   Reproductive/Obstetrics                             Anesthesia Physical Anesthesia Plan  ASA: III  Anesthesia Plan: MAC   Post-op Pain Management:    Induction: Intravenous  PONV Risk Score and Plan: 2 and Propofol infusion and Treatment may vary due to age or medical condition  Airway Management Planned: Nasal Cannula  Additional Equipment:   Intra-op Plan:   Post-operative Plan:   Informed Consent: I have reviewed the patients History and Physical, chart, labs and discussed the procedure including the risks, benefits and alternatives for the proposed anesthesia with the patient or authorized representative who has indicated his/her understanding and acceptance.       Plan Discussed with: CRNA, Anesthesiologist and Surgeon  Anesthesia Plan Comments:         Anesthesia Quick Evaluation

## 2020-02-11 NOTE — Care Management Obs Status (Signed)
MEDICARE OBSERVATION STATUS NOTIFICATION   Patient Details  Name: Jennifer Baxter MRN: [id number] Date of Birth: April 11, 1951   Medicare Observation Status Notification Given:  Yes    Gala Lewandowsky, RN 02/11/2020, 5:02 PM

## 2020-02-11 NOTE — Anesthesia Procedure Notes (Signed)
Procedure Name: MAC Date/Time: 02/11/2020 11:25 AM Performed by: Griffin Dakin, CRNA Pre-anesthesia Checklist: Patient identified, Emergency Drugs available, Suction available and Patient being monitored Patient Re-evaluated:Patient Re-evaluated prior to induction Oxygen Delivery Method: Nasal cannula Induction Type: IV induction Placement Confirmation: positive ETCO2 and breath sounds checked- equal and bilateral Dental Injury: Teeth and Oropharynx as per pre-operative assessment

## 2020-02-11 NOTE — Op Note (Signed)
[facility] Patient Name: Jennifer Baxter Procedure Date : 02/11/2020 MRN: [id number] Attending MD: Rachael Fee , MD Date of Birth: Feb 06, 1951 CSN: [id number] Age: 69 Admit Type: Inpatient Procedure:                Upper GI endoscopy Indications:              Acute post hemorrhagic anemia, Melena; remote his                            of bleeding gastric Dielafoy's lesion twice Providers:                Rachael Fee, MD, Vicki Mallet, RN, Adolph Pollack, RN, Wanita Chamberlain, Technician Referring MD:              Medicines:                Monitored Anesthesia Care Complications:            No immediate complications. Estimated blood loss:                            None. Estimated Blood Loss:     Estimated blood loss: none. Procedure:                Pre-Anesthesia Assessment:                           - Prior to the procedure, a History and Physical                            was performed, and patient medications and                            allergies were reviewed. The patient's tolerance of                            previous anesthesia was also reviewed. The risks                            and benefits of the procedure and the sedation                            options and risks were discussed with the patient.                            All questions were answered, and informed consent                            was obtained. Prior Anticoagulants: The patient has                            taken no previous anticoagulant or antiplatelet  agents. ASA Grade Assessment: III - A patient with                            severe systemic disease. After reviewing the risks                            and benefits, the patient was deemed in                            satisfactory condition to undergo the procedure.                           After obtaining informed consent, the endoscope was    passed under direct vision. Throughout the                            procedure, the patient's blood pressure, pulse, and                            oxygen saturations were monitored continuously. The                            [id number]-[id number] ([id number]) Olympus gastroscope was                            introduced through the mouth, and advanced to the                            second part of duodenum. The upper GI endoscopy was                            accomplished without difficulty. The patient                            tolerated the procedure well. Scope In: Scope Out: Findings:      There was no recent or old blood in the UGI tract.      One 62mm fairly shallow ulcer was found in the gastric antrum, about       3-4cm proximal to the pylorus. There was a flat pigmented spot within       the ulcer. The nearby mucosa was heaped up, edematous, not frankly       neoplastic appearing however it was biospied with forceps and sent to       pathology.      The exam was otherwise without abnormality. Impression:               - No blood in UGI tract.                           - Distal gastric ulcer which is at low risk for                            rebleeding, does not appear neoplastic. Biospies  were taken to check for neoplasm and H. pylori. Recommendation:           - She is likely safe for d/c tomorrow as long as no                            overt rebleeding.                           - She should continue PPI BID for at least the next                            two months and then once montly thereafter.                           - She should no restart ASA or Alleve for 7 days.                           - She will need follow up EGD ikn 7-8 weeks, that                            can be by her primary GI unless she prefers I do                            that for her.                           - I will contact her with the final pathology                             report, she does not need to stay in the hospital                            until that is back.                           - Will advance her diet, change to oral PPI. Please                            call or page with any further questions or concerns. Procedure Code(s):        --- Professional ---                           [phone], Esophagogastroduodenoscopy, flexible,                            transoral; with biopsy, single or multiple Diagnosis Code(s):        --- Professional ---                           K25.9, Gastric ulcer, unspecified as acute or                            chronic, without hemorrhage or  perforation                           D62, Acute posthemorrhagic anemia                           K92.1, Melena (includes Hematochezia) CPT copyright 2019 American Medical Association. All rights reserved. The codes documented in this report are preliminary and upon coder review may  be revised to meet current compliance requirements. Rachael Fee, MD 02/11/2020 11:41:16 AM This report has been signed electronically. Number of Addenda: 0

## 2020-02-11 NOTE — Progress Notes (Signed)
PROGRESS NOTE  Jennifer Baxter UKG:[id number] DOB: 1951-06-04 DOA: 02/10/2020 PCP: Gaspar Garbe, MD  Brief History   Jennifer Baxter is a 69 y.o. female with medical history significant of hypertension, PVCs, HOCM, GI bleed secondary toDielafoy's vascular malformation, and arthritis presents with complaints of dark stools.  She woke up around 7 AM this morning and felt lightheaded.  She started drinking a lot of fluids and when she went to use the bathroom almost passed out.  Thereafter she laid in the bed to see if symptoms would prove.  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  They had tried to check orthostatic vital signs but were unable.  With changes in position from laying to sitting her heart rates would be significantly elevated and systolic blood pressures noted to be in the 90s.  At some point her stomach started rumbling and when she went to use the bathroom had a dark tarry stool.  Since onset of symptoms she has had a total of 4 dark tarry stools.  Notes associated symptoms of epigastric abdominal discomfort.  She has had symptoms like this previously in the past related to [facility] vascular malformations causing GI bleeds occurring initially in 2000/2001 and second episode in 2013.  Patient admits that she uses naproxen 2-3 times per week for arthritis.    ED Course: Upon admission into the emergency department patient was noted to be afebrile with a pulse up to 111, blood pressures 95/75-130/84, and all other vital signs maintained.  Labs significant for hemoglobin 11.4, BUN 35, creatinine 0.89, and lactic acid 2.9.  Stool guaiacs were noted to be positive.  Patient was typed and screened for possible need blood products and given 40 mg of Protonix IV.  [facility] consulted.  [facility] called to admit.  Gastroenterology has taken the patient for EGD this morning. She was found to have a distal gastric ulcer which did not appear neoplastic. GI recommendation was to monitor the  patient overnight and to monitor her hemoglobin. She will be continued on BID PPI for the next 2 months, and then once a day thereafter. ASA and all NSAIDS should be held for the next 7 days. GI will contact the patient with the final pathology report from the biopsies.  Consultants  . Gastroenterology  Procedures  . EGD  Antibiotics   Anti-infectives (From admission, onward)   None    .  Subjective  The patient is seen following her procedure. No new complaints.  Objective   Vitals:  Vitals:   02/11/20 1241 02/11/20 1631  BP: 116/63 107/64  Pulse:    Resp: 14 16  Temp: 98.1 F (36.7 C) 98.8 F (37.1 C)  SpO2:     Exam:  Constitutional:  . The patient is awake, alert, and oriented x 3. No acute distress. Respiratory:  . No increased work of breathing. . No wheezes, rales, or rhonchi . No tactile fremitus Cardiovascular:  . Regular rate and rhythm . No murmurs, ectopy, or gallups. . No lateral PMI. No thrills. Abdomen:  . Abdomen is soft, non-tender, non-distended . No hernias, masses, or organomegaly . Normoactive bowel sounds.  Musculoskeletal:  . No cyanosis, clubbing, or edema Skin:  . No rashes, lesions, ulcers . palpation of skin: no induration or nodules Neurologic:  . CN 2-12 intact . Sensation all 4 extremities intact Psychiatric:  . Mental status o Mood, affect appropriate o Orientation to person, place, time  . judgment and insight appear intact  I  have personally reviewed the following:   Today's Data  . Vitals, CBC, BMP  Micro Data  . Urine culture has had only insignificant growth.  Scheduled Meds: . pantoprazole  40 mg Oral BID AC  . sodium chloride flush  3 mL Intravenous Q12H   Continuous Infusions:  Principal Problem:   Upper GI bleed Active Problems:   Lactic acidosis   Acute blood loss anemia   Arthritis   Essential hypertension   LOS: 0 days   A & P   Acute blood loss anemia: Hemoglobin stable since last night.  Continue to monitor.   Upper GI Bleed due to distal gastric ulcer: Viewed on EGD. GI recommends bid PPI for 2 months followed by one a day PPI thereafter. She is to hold ASA and NSAIDs for one week. GI has also recommended that her hemoglobin be followed overnight. Her regular diet has been restarted. Ii appreciate GI's assistance.   Lactic acidosis: Acute. Resolved.  Essential hypertension: Blood pressures currently low normal.  Home blood pressure medications include amlodipine 2.5 mg daily, chlorthalidone 25 mg 2 times a week, and metoprolol 100 mg twice daily. These are still held as blood pressure is  Arthritis: Home medications include naproxen 500 mg as needed for arthritis pains. Hold naproxen.  I have seen and examined this patient myself. I have spent 35 minutes in her evaluation and treatment.  DVT prophylaxis: SCDs Code Status: Full Family Communication: Husband updated at bedside Disposition Plan: Possible discharge home in 1 day  Landyn Buckalew, DO [facility] Direct contact: see [email or web address]  7PM-7AM contact night coverage as above 02/11/2020, 6:13 PM  LOS: 0 days

## 2020-02-12 DIAGNOSIS — K254 Chronic or unspecified gastric ulcer with hemorrhage: Principal | ICD-10-CM

## 2020-02-12 LAB — CBC
HCT: 25.2 % — ABNORMAL LOW (ref 36.0–46.0)
Hemoglobin: 8.5 g/dL — ABNORMAL LOW (ref 12.0–15.0)
MCH: 31.3 pg (ref 26.0–34.0)
MCHC: 33.7 g/dL (ref 30.0–36.0)
MCV: 92.6 fL (ref 80.0–100.0)
Platelets: 161 10*3/uL (ref 150–400)
RBC: 2.72 MIL/uL — ABNORMAL LOW (ref 3.87–5.11)
RDW: 12.8 % (ref 11.5–15.5)
WBC: 5.7 10*3/uL (ref 4.0–10.5)
nRBC: 0 % (ref 0.0–0.2)

## 2020-02-12 LAB — BASIC METABOLIC PANEL
Anion gap: 7 (ref 5–15)
BUN: 16 mg/dL (ref 8–23)
CO2: 23 mmol/L (ref 22–32)
Calcium: 9 mg/dL (ref 8.9–10.3)
Chloride: 111 mmol/L (ref 98–111)
Creatinine, Ser: 1 mg/dL (ref 0.44–1.00)
GFR calc Af Amer: >60 mL/min (ref >60)
GFR calc non Af Amer: 57 mL/min — ABNORMAL LOW (ref >60)
Glucose, Bld: 102 mg/dL — ABNORMAL HIGH (ref 70–99)
Potassium: 4 mmol/L (ref 3.5–5.1)
Sodium: 141 mmol/L (ref 135–145)

## 2020-02-12 LAB — HEMOGLOBIN AND HEMATOCRIT, BLOOD
HCT: 26.7 % — ABNORMAL LOW (ref 36.0–46.0)
Hemoglobin: 9.1 g/dL — ABNORMAL LOW (ref 12.0–15.0)

## 2020-02-12 MED ORDER — PANTOPRAZOLE SODIUM 40 MG PO TBEC: 40.0000 mg | DELAYED_RELEASE_TABLET | Freq: Two times a day (BID) | ORAL | 0 refills | Status: DC

## 2020-02-12 NOTE — Plan of Care (Signed)

## 2020-02-12 NOTE — Progress Notes (Signed)
   02/11/20 0955  Assess: MEWS Score  BP 102/65  Pulse Rate (!) 101  ECG Heart Rate (!) 105  SpO2 98 %  Assess: MEWS Score  MEWS Temp 0  MEWS Systolic 0  MEWS Pulse 1  MEWS RR 1  MEWS LOC 0  MEWS Score 2  MEWS Score Color Yellow  Assess: if the MEWS score is Yellow or Red  Were vital signs taken at a resting state? Yes  Focused Assessment Documented focused assessment  Early Detection of Sepsis Score *See Row Information* Low  MEWS guidelines implemented *See Row Information* No, vital signs rechecked  Treat  MEWS Interventions Administered scheduled meds/treatments  Take Vital Signs  Increase Vital Sign Frequency  Yellow: Q 2hr X 2 then Q 4hr X 2, if remains yellow, continue Q 4hrs  Notify: Charge Nurse/RN  Name of Charge Nurse/RN Notified Shanda Bumps  Date Charge Nurse/RN Notified 02/11/20  Time Charge Nurse/RN Notified 1100  Document  Patient Outcome Stabilized after interventions  Progress note created (see row info) Yes

## 2020-02-16 LAB — SURGICAL PATHOLOGY

## 2020-02-16 NOTE — Discharge Summary (Signed)
Physician Discharge Summary  Jennifer Baxter OEV:[id number] DOB: 09-22-1951 DOA: 02/10/2020  PCP: Gaspar Garbe, MD  Admit date: 02/10/2020 Discharge date: 02/16/2020  Recommendations for Outpatient Follow-up:  1. Follow up with PCP in 7-10 days. 2. Have CBC checked on that date 3. No Aspirin or NSAIDS for 1 week. 4. Follow up with Dr. Christella Hartigan in 7-8 weeks for follow up EGD.  Discharge Diagnoses: Principal diagnosis is #1 1. Acute blood loss anemia due to upper GI bleed 2. Distal gastric ulcer 3. Lactic acidosis 4. Essential hypertension 5. Arthritis  Discharge Condition: Fair  Disposition: To home  Diet recommendation: Soft diet  Filed Weights   02/10/20 1402 02/11/20 0338 02/12/20 0400  Weight: 81.6 kg 82.4 kg 83.8 kg    History of present illness:   Jennifer Baxter is a 69 y.o. female with medical history significant of hypertension, PVCs, HOCM, GI bleed secondary toDielafoy's vascular malformation, and arthritis presents with complaints of dark stools.  She woke up around 7 AM this morning and felt lightheaded.  She started drinking a lot of fluids and when she went to use the bathroom almost passed out.  Thereafter she laid in the bed to see if symptoms would prove.  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  They had tried to check orthostatic vital signs but were unable.  With changes in position from laying to sitting her heart rates would be significantly elevated and systolic blood pressures noted to be in the 90s.  At some point her stomach started rumbling and when she went to use the bathroom had a dark tarry stool.  Since onset of symptoms she has had a total of 4 dark tarry stools.  Notes associated symptoms of epigastric abdominal discomfort.  She has had symptoms like this previously in the past related to [facility] vascular malformations causing GI bleeds occurring initially in 2000/2001 and second episode in 2013.  Patient admits that she uses naproxen  2-3 times per week for arthritis.    ED Course: Upon admission into the emergency department patient was noted to be afebrile with a pulse up to 111, blood pressures 95/75-130/84, and all other vital signs maintained.  Labs significant for hemoglobin 11.4, BUN 35, creatinine 0.89, and lactic acid 2.9.  Stool guaiacs were noted to be positive.  Patient was typed and screened for possible need blood products and given 40 mg of Protonix IV.  [facility] consulted.  [facility] called to admit.  Hospital Course:  Gastroenterology has taken the patient for EGD this morning. She was found to have a distal gastric ulcer which did not appear neoplastic. GI recommendation was to monitor the patient overnight and to monitor her hemoglobin. She will be continued on BID PPI for the next 2 months, and then once a day thereafter. ASA and all NSAIDS should be held for the next 7 days. GI will contact the patient with the final pathology report from the biopsies.  The patient's hemoglobin has remained stable. Hemoglobin on discharge is 9.1. She will be discharged to home in good condition.  Today's assessment: S: The patient is resting comfortably. No new complaints. O: Vitals:  Vitals:   02/12/20 1259 02/12/20 1303  BP:    Pulse: 81 82  Resp:  18  Temp:    SpO2: 97% 100%   Exam:  Constitutional:   The patient is awake, alert, and oriented x 3. No acute distress. Respiratory:   No increased work of breathing.  No  wheezes, rales, or rhonchi  No tactile fremitus Cardiovascular:   Regular rate and rhythm  No murmurs, ectopy, or gallups.  No lateral PMI. No thrills. Abdomen:   Abdomen is soft, non-tender, non-distended  No hernias, masses, or organomegaly  Normoactive bowel sounds.  Musculoskeletal:   No cyanosis, clubbing, or edema Skin:   No rashes, lesions, ulcers  palpation of skin: no induration or nodules Neurologic:   CN 2-12 intact  Sensation all 4 extremities  intact Psychiatric:   Mental status ? Mood, affect appropriate ? Orientation to person, place, time   judgment and insight appear intact   Discharge Instructions  Discharge Instructions    Activity as tolerated - No restrictions   Complete by: As directed    Call MD for:  difficulty breathing, headache or visual disturbances   Complete by: As directed    Call MD for:  extreme fatigue   Complete by: As directed    Call MD for:  persistant dizziness or light-headedness   Complete by: As directed    Diet - low sodium heart healthy   Complete by: As directed    Discharge instructions   Complete by: As directed    Follow up with PCP in 7-10 days. Have CBC checked on that date No Aspirin or NSAIDS for 1 week. Follow up with Dr. Ardis Hughs in 7-8 weeks for follow up EGD.   Increase activity slowly   Complete by: As directed      Allergies as of 02/12/2020      Reactions   Codeine Hives, Itching, Rash   Morphine Hives, Itching, Other (See Comments)   Questionable Arm swelled      Medication List    STOP taking these medications   amLODipine 5 MG tablet Commonly known as: NORVASC   chlorthalidone 25 MG tablet Commonly known as: HYGROTON   diltiazem 120 MG 24 hr capsule Commonly known as: CARDIZEM CD   metoprolol succinate 100 MG 24 hr tablet Commonly known as: TOPROL-XL   naproxen 500 MG tablet Commonly known as: NAPROSYN   potassium chloride 10 MEQ CR capsule Commonly known as: MICRO-K     TAKE these medications   pantoprazole 40 MG tablet Commonly known as: PROTONIX Take 1 tablet (40 mg total) by mouth 2 (two) times daily before a meal.   Vitamin D 125 MCG (5000 UT) Caps Take 5,000 Units by mouth See admin instructions. Takes 5000 units by mouth 3 times a week (Monday,Wednesday and Friday)      Allergies  Allergen Reactions  . Codeine Hives, Itching and Rash  . Morphine Hives, Itching and Other (See Comments)    Questionable Arm swelled     The  results of significant diagnostics from this hospitalization (including imaging, microbiology, ancillary and laboratory) are listed below for reference.    Significant Diagnostic Studies: CT BIOPSY  Result Date: Feb 18, 2020 CLINICAL DATA:  Left pelvic hip and leg pain. Sacroiliac dysfunction suspected clinically. EXAM: Left CT GUIDED SI JOINT INJECTION PROCEDURE: After a thorough discussion of risks and benefits of the procedure, including bleeding, infection, injury to nerves, blood vessels, and adjacent structures, verbal and written consent was obtained. Specific risks of the procedure included nondiagnostic/nontherapeutic injection and non target injection. The patient was placed prone on the CT table and localization was performed over the sacrum. Target site marked using CT guidance. The skin was prepped and draped in the usual sterile fashion using Betadine soap. After local anesthesia with 1% lidocaine without epinephrine and subsequent  deep anesthesia, a 22 gauge spinal needle was advanced into the left SI joint under intermittent CT guidance. Once the needle was in satisfactory position, representative image was captured with the needle demonstrated in the sacroiliac joint. Subsequently, 2.5 cc 0.5% bupivacaine was injected into the left SI joint. Needles removed and a sterile dressing applied. No complications were observed. IMPRESSION: Successful CT-guided left SI joint injection. Patient felt definite concordant pain during the injection. She was instructed to keep a pain log for follow-up with Dr. Yevette Edwards. Electronically Signed   By: Paulina Fusi M.D.   On: 01/22/2020 08:59   DG CHEST PORT 1 VIEW  Result Date: 02/10/2020 CLINICAL DATA:  Gastrointestinal bleeding EXAM: PORTABLE CHEST 1 VIEW COMPARISON:  05/24/2019 FINDINGS: Single frontal view of the chest demonstrates an unremarkable cardiac silhouette. No airspace disease, effusion, or pneumothorax. No acute bony abnormality. IMPRESSION: 1.  No acute intrathoracic process. Electronically Signed   By: Sharlet Salina M.D.   On: 02/10/2020 17:36    Microbiology: Recent Results (from the past 240 hour(s))  SARS Coronavirus 2 by RT PCR (hospital order, performed in [facility] hospital lab) Nasopharyngeal Nasopharyngeal Swab     Status: None   Collection Time: 02/10/20  2:33 PM   Specimen: Nasopharyngeal Swab  Result Value Ref Range Status   SARS Coronavirus 2 NEGATIVE NEGATIVE Final    Comment: (NOTE) SARS-CoV-2 target nucleic acids are NOT DETECTED. The SARS-CoV-2 RNA is generally detectable in upper and lower respiratory specimens during the acute phase of infection. The lowest concentration of SARS-CoV-2 viral copies this assay can detect is 250 copies / mL. A negative result does not preclude SARS-CoV-2 infection and should not be used as the sole basis for treatment or other patient management decisions.  A negative result may occur with improper specimen collection / handling, submission of specimen other than nasopharyngeal swab, presence of viral mutation(s) within the areas targeted by this assay, and inadequate number of viral copies (<250 copies / mL). A negative result must be combined with clinical observations, patient history, and epidemiological information. Fact Sheet for Patients:   BoilerBrush.com.cy Fact Sheet for Healthcare Providers: https://pope.com/ This test is not yet approved or cleared  by the [place] FDA and has been authorized for detection and/or diagnosis of SARS-CoV-2 by FDA under an Emergency Use Authorization (EUA).  This EUA will remain in effect (meaning this test can be used) for the duration of the COVID-19 declaration under Section 564(b)(1) of the Act, 21 U.S.C. section 360bbb-3(b)(1), unless the authorization is terminated or revoked sooner. Performed at [facility], [address] [place], [place] [address]   Urine  culture     Status: Abnormal   Collection Time: 02/10/20  3:36 PM   Specimen: Urine, Random  Result Value Ref Range Status   Specimen Description URINE, RANDOM  Final   Special Requests NONE  Final   Culture (A)  Final    <10,000 COLONIES/mL INSIGNIFICANT GROWTH Performed at [facility], [address] [place], [place] [address]    Report Status 02/11/2020 FINAL  Final     Labs: Basic Metabolic Panel: Recent Labs  Lab 02/10/20 1452 02/11/20 0136 02/12/20 0311  NA 139 141 141  K 4.2 4.1 4.0  CL 105 109 111  CO2 23 24 23   GLUCOSE 107* 96 102*  BUN 35* 28* 16  CREATININE 0.89 0.95 1.00  CALCIUM 9.4 8.9 9.0   Liver Function Tests: Recent Labs  Lab 02/10/20 1452  AST 23  ALT 22  ALKPHOS 34*  BILITOT 0.9  PROT 6.3*  ALBUMIN 3.9   Recent Labs  Lab 02/10/20 1452  LIPASE 27   No results for input(s): AMMONIA in the last 168 hours. CBC: Recent Labs  Lab 02/10/20 1452 02/10/20 1638 02/10/20 2013 02/11/20 0136 02/11/20 0655 02/12/20 0311 02/12/20 1135  WBC 6.9  --   --  6.0 5.1 5.7  --   NEUTROABS 4.4  --   --   --   --   --   --   HGB 11.4*   < > 9.6* 9.0* 8.8* 8.5* 9.1*  HCT 34.3*   < > 28.0* 26.4* 26.3* 25.2* 26.7*  MCV 92.0  --   --  91.0 91.6 92.6  --   PLT 204  --   --  173 168 161  --    < > = values in this interval not displayed.   Cardiac Enzymes: No results for input(s): CKTOTAL, CKMB, CKMBINDEX, TROPONINI in the last 168 hours. BNP: BNP (last 3 results) No results for input(s): BNP in the last 8760 hours.  ProBNP (last 3 results) No results for input(s): PROBNP in the last 8760 hours.  CBG: No results for input(s): GLUCAP in the last 168 hours.  Principal Problem:   Upper GI bleed Active Problems:   Lactic acidosis   Acute blood loss anemia   Arthritis   Essential hypertension   Time coordinating discharge: 38 minutes  Signed:        Jasmarie Coppock, DO [facility]  02/16/2020, 5:58 PM

## 2020-02-23 NOTE — Anesthesia Postprocedure Evaluation (Signed)
Anesthesia Post Note  Patient: Jennifer Baxter  Procedure(s) Performed: ESOPHAGOGASTRODUODENOSCOPY (EGD) WITH PROPOFOL (N/A ) BIOPSY     Patient location during evaluation: Endoscopy Anesthesia Type: MAC Level of consciousness: awake and alert Pain management: pain level controlled Vital Signs Assessment: post-procedure vital signs reviewed and stable Respiratory status: spontaneous breathing, nonlabored ventilation, respiratory function stable and patient connected to nasal cannula oxygen Cardiovascular status: blood pressure returned to baseline and stable Postop Assessment: no apparent nausea or vomiting Anesthetic complications: no    Last Vitals:  Vitals:   02/12/20 1259 02/12/20 1303  BP:    Pulse: 81 82  Resp:  18  Temp:    SpO2: 97% 100%    Last Pain:  Vitals:   02/12/20 1151  TempSrc: Oral  PainSc:                  [place]

## 2020-05-20 ENCOUNTER — Other Ambulatory Visit: Payer: Self-pay | Admitting: Orthopedic Surgery

## 2020-05-20 DIAGNOSIS — M25511 Pain in right shoulder: Secondary | ICD-10-CM

## 2020-06-01 ENCOUNTER — Ambulatory Visit
Admission: RE | Admit: 2020-06-01 | Discharge: 2020-06-01 | Disposition: A | Discharge status: Home / Self Care | Payer: Medicare Other | Source: Ambulatory Visit | Attending: Orthopedic Surgery | Admitting: Orthopedic Surgery

## 2020-06-01 DIAGNOSIS — M25511 Pain in right shoulder: Secondary | ICD-10-CM

## 2020-06-15 ENCOUNTER — Encounter (HOSPITAL_BASED_OUTPATIENT_CLINIC_OR_DEPARTMENT_OTHER): Payer: Self-pay | Admitting: Orthopedic Surgery

## 2020-06-15 ENCOUNTER — Other Ambulatory Visit: Payer: Self-pay

## 2020-06-15 NOTE — Progress Notes (Signed)
Patient's chart, cardiology notes and tests reviewed by Dr Hyacinth Meeker. OK for [facility]

## 2020-06-17 ENCOUNTER — Encounter (HOSPITAL_BASED_OUTPATIENT_CLINIC_OR_DEPARTMENT_OTHER)
Admission: RE | Admit: 2020-06-17 | Discharge: 2020-06-17 | Disposition: A | Discharge status: Home / Self Care | Payer: Medicare Other | Source: Ambulatory Visit | Attending: Orthopedic Surgery | Admitting: Orthopedic Surgery

## 2020-06-17 ENCOUNTER — Other Ambulatory Visit (HOSPITAL_COMMUNITY)
Admission: RE | Admit: 2020-06-17 | Discharge: 2020-06-17 | Disposition: A | Discharge status: Home / Self Care | Payer: Medicare Other | Source: Ambulatory Visit | Attending: Orthopedic Surgery | Admitting: Orthopedic Surgery

## 2020-06-17 DIAGNOSIS — I493 Ventricular premature depolarization: Secondary | ICD-10-CM | POA: Diagnosis not present

## 2020-06-17 DIAGNOSIS — Z01812 Encounter for preprocedural laboratory examination: Secondary | ICD-10-CM | POA: Diagnosis present

## 2020-06-17 DIAGNOSIS — Z981 Arthrodesis status: Secondary | ICD-10-CM | POA: Diagnosis not present

## 2020-06-17 DIAGNOSIS — Z885 Allergy status to narcotic agent status: Secondary | ICD-10-CM | POA: Diagnosis not present

## 2020-06-17 DIAGNOSIS — K219 Gastro-esophageal reflux disease without esophagitis: Secondary | ICD-10-CM | POA: Diagnosis not present

## 2020-06-17 DIAGNOSIS — D649 Anemia, unspecified: Secondary | ICD-10-CM | POA: Diagnosis not present

## 2020-06-17 DIAGNOSIS — Z9049 Acquired absence of other specified parts of digestive tract: Secondary | ICD-10-CM | POA: Diagnosis not present

## 2020-06-17 DIAGNOSIS — Z833 Family history of diabetes mellitus: Secondary | ICD-10-CM | POA: Diagnosis not present

## 2020-06-17 DIAGNOSIS — Z79899 Other long term (current) drug therapy: Secondary | ICD-10-CM | POA: Diagnosis not present

## 2020-06-17 DIAGNOSIS — Z8261 Family history of arthritis: Secondary | ICD-10-CM | POA: Diagnosis not present

## 2020-06-17 DIAGNOSIS — Z800 Family history of malignant neoplasm of digestive organs: Secondary | ICD-10-CM | POA: Diagnosis not present

## 2020-06-17 DIAGNOSIS — Z20822 Contact with and (suspected) exposure to covid-19: Secondary | ICD-10-CM | POA: Insufficient documentation

## 2020-06-17 DIAGNOSIS — Z90710 Acquired absence of both cervix and uterus: Secondary | ICD-10-CM | POA: Diagnosis not present

## 2020-06-17 DIAGNOSIS — Z811 Family history of alcohol abuse and dependence: Secondary | ICD-10-CM | POA: Diagnosis not present

## 2020-06-17 DIAGNOSIS — M19011 Primary osteoarthritis, right shoulder: Secondary | ICD-10-CM | POA: Diagnosis not present

## 2020-06-17 DIAGNOSIS — Z823 Family history of stroke: Secondary | ICD-10-CM | POA: Diagnosis not present

## 2020-06-17 DIAGNOSIS — I10 Essential (primary) hypertension: Secondary | ICD-10-CM | POA: Diagnosis not present

## 2020-06-17 DIAGNOSIS — I421 Obstructive hypertrophic cardiomyopathy: Secondary | ICD-10-CM | POA: Diagnosis not present

## 2020-06-17 DIAGNOSIS — E669 Obesity, unspecified: Secondary | ICD-10-CM | POA: Diagnosis not present

## 2020-06-17 DIAGNOSIS — Z8249 Family history of ischemic heart disease and other diseases of the circulatory system: Secondary | ICD-10-CM | POA: Diagnosis not present

## 2020-06-17 DIAGNOSIS — Z6827 Body mass index (BMI) 27.0-27.9, adult: Secondary | ICD-10-CM | POA: Diagnosis not present

## 2020-06-17 LAB — BASIC METABOLIC PANEL
Anion gap: 9 (ref 5–15)
BUN: 13 mg/dL (ref 8–23)
CO2: 24 mmol/L (ref 22–32)
Calcium: 9.7 mg/dL (ref 8.9–10.3)
Chloride: 105 mmol/L (ref 98–111)
Creatinine, Ser: 0.9 mg/dL (ref 0.44–1.00)
GFR calc Af Amer: >60 mL/min (ref >60)
GFR calc non Af Amer: >60 mL/min (ref >60)
Glucose, Bld: 104 mg/dL — ABNORMAL HIGH (ref 70–99)
Potassium: 4.1 mmol/L (ref 3.5–5.1)
Sodium: 138 mmol/L (ref 135–145)

## 2020-06-17 LAB — CBC
HCT: 34.6 % — ABNORMAL LOW (ref 36.0–46.0)
Hemoglobin: 11.6 g/dL — ABNORMAL LOW (ref 12.0–15.0)
MCH: 29.7 pg (ref 26.0–34.0)
MCHC: 33.5 g/dL (ref 30.0–36.0)
MCV: 88.7 fL (ref 80.0–100.0)
Platelets: 212 10*3/uL (ref 150–400)
RBC: 3.9 MIL/uL (ref 3.87–5.11)
RDW: 13.4 % (ref 11.5–15.5)
WBC: 6.5 10*3/uL (ref 4.0–10.5)
nRBC: 0 % (ref 0.0–0.2)

## 2020-06-17 LAB — SURGICAL PCR SCREEN
MRSA, PCR: NEGATIVE
Staphylococcus aureus: POSITIVE — AB

## 2020-06-17 LAB — SARS CORONAVIRUS 2 (TAT 6-24 HRS): SARS Coronavirus 2: NEGATIVE

## 2020-06-17 NOTE — Progress Notes (Signed)
      Enhanced Recovery after Surgery for Orthopedics Enhanced Recovery after Surgery is a protocol used to improve the stress on your body and your recovery after surgery.  Patient Instructions  . The night before surgery:  o No food after midnight. ONLY clear liquids after midnight  . The day of surgery (if you do NOT have diabetes):  o Drink ONE (1) Pre-Surgery Clear Ensure as directed.   o This drink was given to you during your hospital  pre-op appointment visit. o The pre-op nurse will instruct you on the time to drink the  Pre-Surgery Ensure depending on your surgery time. o Finish the drink at the designated time by the pre-op nurse.  o Nothing else to drink after completing the  Pre-Surgery Clear Ensure.  . The day of surgery (if you have diabetes): o Drink ONE (1) Gatorade 2 (G2) as directed. o This drink was given to you during your hospital  pre-op appointment visit.  o The pre-op nurse will instruct you on the time to drink the   Gatorade 2 (G2) depending on your surgery time. o Color of the Gatorade may vary. Red is not allowed. o Nothing else to drink after completing the  Gatorade 2 (G2).  Patient given CHG pre-surgical soap and benzoyl Peroxide. Patient verbalized understanding.         If you have questions, please contact your surgeon's office.

## 2020-06-21 ENCOUNTER — Encounter (HOSPITAL_BASED_OUTPATIENT_CLINIC_OR_DEPARTMENT_OTHER)
Admission: RE | Disposition: A | Discharge status: Home / Self Care | Payer: Self-pay | Source: Home / Self Care | Attending: Orthopedic Surgery

## 2020-06-21 ENCOUNTER — Encounter (HOSPITAL_BASED_OUTPATIENT_CLINIC_OR_DEPARTMENT_OTHER): Payer: Self-pay | Admitting: Orthopedic Surgery

## 2020-06-21 ENCOUNTER — Ambulatory Visit (HOSPITAL_BASED_OUTPATIENT_CLINIC_OR_DEPARTMENT_OTHER)
Admission: RE | Admit: 2020-06-21 | Discharge: 2020-06-21 | Disposition: A | Discharge status: Home / Self Care | Payer: Medicare Other | Attending: Orthopedic Surgery | Admitting: Orthopedic Surgery

## 2020-06-21 ENCOUNTER — Ambulatory Visit (HOSPITAL_BASED_OUTPATIENT_CLINIC_OR_DEPARTMENT_OTHER): Payer: Medicare Other | Admitting: Certified Registered"

## 2020-06-21 ENCOUNTER — Ambulatory Visit (HOSPITAL_COMMUNITY): Payer: Medicare Other

## 2020-06-21 ENCOUNTER — Other Ambulatory Visit: Payer: Self-pay

## 2020-06-21 DIAGNOSIS — K219 Gastro-esophageal reflux disease without esophagitis: Secondary | ICD-10-CM | POA: Diagnosis not present

## 2020-06-21 DIAGNOSIS — Z8249 Family history of ischemic heart disease and other diseases of the circulatory system: Secondary | ICD-10-CM | POA: Insufficient documentation

## 2020-06-21 DIAGNOSIS — Z885 Allergy status to narcotic agent status: Secondary | ICD-10-CM | POA: Insufficient documentation

## 2020-06-21 DIAGNOSIS — D649 Anemia, unspecified: Secondary | ICD-10-CM | POA: Diagnosis not present

## 2020-06-21 DIAGNOSIS — Z79899 Other long term (current) drug therapy: Secondary | ICD-10-CM | POA: Insufficient documentation

## 2020-06-21 DIAGNOSIS — Z823 Family history of stroke: Secondary | ICD-10-CM | POA: Insufficient documentation

## 2020-06-21 DIAGNOSIS — Z9049 Acquired absence of other specified parts of digestive tract: Secondary | ICD-10-CM | POA: Insufficient documentation

## 2020-06-21 DIAGNOSIS — Z8261 Family history of arthritis: Secondary | ICD-10-CM | POA: Insufficient documentation

## 2020-06-21 DIAGNOSIS — I10 Essential (primary) hypertension: Secondary | ICD-10-CM | POA: Insufficient documentation

## 2020-06-21 DIAGNOSIS — Z811 Family history of alcohol abuse and dependence: Secondary | ICD-10-CM | POA: Insufficient documentation

## 2020-06-21 DIAGNOSIS — Z981 Arthrodesis status: Secondary | ICD-10-CM | POA: Insufficient documentation

## 2020-06-21 DIAGNOSIS — Z96611 Presence of right artificial shoulder joint: Secondary | ICD-10-CM

## 2020-06-21 DIAGNOSIS — E669 Obesity, unspecified: Secondary | ICD-10-CM | POA: Insufficient documentation

## 2020-06-21 DIAGNOSIS — I421 Obstructive hypertrophic cardiomyopathy: Secondary | ICD-10-CM | POA: Insufficient documentation

## 2020-06-21 DIAGNOSIS — Z833 Family history of diabetes mellitus: Secondary | ICD-10-CM | POA: Insufficient documentation

## 2020-06-21 DIAGNOSIS — M19011 Primary osteoarthritis, right shoulder: Secondary | ICD-10-CM | POA: Insufficient documentation

## 2020-06-21 DIAGNOSIS — I493 Ventricular premature depolarization: Secondary | ICD-10-CM | POA: Insufficient documentation

## 2020-06-21 DIAGNOSIS — Z90710 Acquired absence of both cervix and uterus: Secondary | ICD-10-CM | POA: Insufficient documentation

## 2020-06-21 DIAGNOSIS — Z6827 Body mass index (BMI) 27.0-27.9, adult: Secondary | ICD-10-CM | POA: Insufficient documentation

## 2020-06-21 DIAGNOSIS — Z800 Family history of malignant neoplasm of digestive organs: Secondary | ICD-10-CM | POA: Insufficient documentation

## 2020-06-21 HISTORY — DX: Nausea with vomiting, unspecified: R11.2

## 2020-06-21 HISTORY — DX: Cardiac arrhythmia, unspecified: I49.9

## 2020-06-21 HISTORY — PX: TOTAL SHOULDER ARTHROPLASTY: SHX126

## 2020-06-21 HISTORY — DX: Other specified congenital malformations of peripheral vascular system: Q27.8

## 2020-06-21 HISTORY — DX: Gastro-esophageal reflux disease without esophagitis: K21.9

## 2020-06-21 HISTORY — DX: Other specified postprocedural states: Z98.890

## 2020-06-21 HISTORY — DX: Other complications of anesthesia, initial encounter: T88.59XA

## 2020-06-21 SURGERY — ARTHROPLASTY, SHOULDER, TOTAL
Anesthesia: General | Site: Shoulder | Laterality: Right

## 2020-06-21 MED ORDER — SUGAMMADEX SODIUM 200 MG/2ML IV SOLN
INTRAVENOUS | Status: DC | PRN
  Administered 2020-06-21: 200 mg via INTRAVENOUS

## 2020-06-21 MED ORDER — MIDAZOLAM HCL 2 MG/2ML IJ SOLN
INTRAMUSCULAR | Status: AC
  Filled 2020-06-21: qty 2

## 2020-06-21 MED ORDER — FENTANYL CITRATE (PF) 100 MCG/2ML IJ SOLN
INTRAMUSCULAR | Status: AC
  Filled 2020-06-21: qty 2

## 2020-06-21 MED ORDER — ONDANSETRON HCL 4 MG/2ML IJ SOLN
INTRAMUSCULAR | Status: DC | PRN
  Administered 2020-06-21: 4 mg via INTRAVENOUS

## 2020-06-21 MED ORDER — BUPIVACAINE HCL (PF) 0.5 % IJ SOLN
INTRAMUSCULAR | Status: DC | PRN
  Administered 2020-06-21: 20 mL via PERINEURAL

## 2020-06-21 MED ORDER — FENTANYL CITRATE (PF) 100 MCG/2ML IJ SOLN
100.0000 ug | Freq: Once | INTRAMUSCULAR | Status: AC
  Administered 2020-06-21: 50 ug via INTRAVENOUS

## 2020-06-21 MED ORDER — PROMETHAZINE HCL 25 MG/ML IJ SOLN: 6.2500 mg | INTRAMUSCULAR | Status: DC | PRN

## 2020-06-21 MED ORDER — CEFAZOLIN SODIUM-DEXTROSE 2-4 GM/100ML-% IV SOLN
2.0000 g | INTRAVENOUS | Status: AC
  Administered 2020-06-21: 2 g via INTRAVENOUS

## 2020-06-21 MED ORDER — ACETAMINOPHEN 500 MG PO TABS
1000.0000 mg | ORAL_TABLET | Freq: Once | ORAL | Status: AC
  Administered 2020-06-21: 1000 mg via ORAL

## 2020-06-21 MED ORDER — PHENYLEPHRINE 40 MCG/ML (10ML) SYRINGE FOR IV PUSH (FOR BLOOD PRESSURE SUPPORT)
PREFILLED_SYRINGE | INTRAVENOUS | Status: DC | PRN
  Administered 2020-06-21 (×2): 120 ug via INTRAVENOUS
  Administered 2020-06-21 (×2): 80 ug via INTRAVENOUS

## 2020-06-21 MED ORDER — DEXAMETHASONE SODIUM PHOSPHATE 10 MG/ML IJ SOLN
INTRAMUSCULAR | Status: DC | PRN
  Administered 2020-06-21: 10 mg via INTRAVENOUS

## 2020-06-21 MED ORDER — ROCURONIUM BROMIDE 10 MG/ML (PF) SYRINGE
PREFILLED_SYRINGE | INTRAVENOUS | Status: DC | PRN
  Administered 2020-06-21: 80 mg via INTRAVENOUS

## 2020-06-21 MED ORDER — EPHEDRINE SULFATE-NACL 50-0.9 MG/10ML-% IV SOSY
PREFILLED_SYRINGE | INTRAVENOUS | Status: DC | PRN
  Administered 2020-06-21: 15 mg via INTRAVENOUS

## 2020-06-21 MED ORDER — ACETAMINOPHEN 500 MG PO TABS
ORAL_TABLET | ORAL | Status: AC
  Filled 2020-06-21: qty 2

## 2020-06-21 MED ORDER — DROPERIDOL 2.5 MG/ML IJ SOLN
INTRAMUSCULAR | Status: AC
  Filled 2020-06-21: qty 2

## 2020-06-21 MED ORDER — PROPOFOL 10 MG/ML IV BOLUS
INTRAVENOUS | Status: DC | PRN
  Administered 2020-06-21: 120 mg via INTRAVENOUS

## 2020-06-21 MED ORDER — OXYCODONE HCL 5 MG/5ML PO SOLN: 5.0000 mg | Freq: Once | ORAL | Status: DC | PRN

## 2020-06-21 MED ORDER — POVIDONE-IODINE 10 % EX SWAB
2.0000 "application " | Freq: Once | CUTANEOUS | Status: AC
  Administered 2020-06-21: 2 via TOPICAL

## 2020-06-21 MED ORDER — SODIUM CHLORIDE 0.9 % IR SOLN
Status: DC | PRN
  Administered 2020-06-21: 500 mL

## 2020-06-21 MED ORDER — MIDAZOLAM HCL 2 MG/2ML IJ SOLN
2.0000 mg | Freq: Once | INTRAMUSCULAR | Status: AC
  Administered 2020-06-21: 2 mg via INTRAVENOUS

## 2020-06-21 MED ORDER — DROPERIDOL 2.5 MG/ML IJ SOLN
INTRAMUSCULAR | Status: DC | PRN
  Administered 2020-06-21: .625 mg via INTRAVENOUS

## 2020-06-21 MED ORDER — OXYCODONE HCL 5 MG PO TABS: 5.0000 mg | ORAL_TABLET | Freq: Once | ORAL | Status: DC | PRN

## 2020-06-21 MED ORDER — BUPIVACAINE LIPOSOME 1.3 % IJ SUSP
INTRAMUSCULAR | Status: DC | PRN
  Administered 2020-06-21: 10 mL via PERINEURAL

## 2020-06-21 MED ORDER — PHENYLEPHRINE HCL-NACL 10-0.9 MG/250ML-% IV SOLN
INTRAVENOUS | Status: DC | PRN
  Administered 2020-06-21: 50 ug/min via INTRAVENOUS

## 2020-06-21 MED ORDER — LACTATED RINGERS IV SOLN: INTRAVENOUS | Status: DC

## 2020-06-21 MED ORDER — CEFAZOLIN SODIUM-DEXTROSE 2-4 GM/100ML-% IV SOLN
INTRAVENOUS | Status: AC
  Filled 2020-06-21: qty 100

## 2020-06-21 MED ORDER — HYDROMORPHONE HCL 1 MG/ML IJ SOLN: 0.2500 mg | INTRAMUSCULAR | Status: DC | PRN

## 2020-06-21 MED ORDER — LIDOCAINE 2% (20 MG/ML) 5 ML SYRINGE
INTRAMUSCULAR | Status: DC | PRN
  Administered 2020-06-21: 60 mg via INTRAVENOUS

## 2020-06-21 SURGICAL SUPPLY — 87 items
ADH SKN CLS APL DERMABOND .7 (GAUZE/BANDAGES/DRESSINGS)
ADPR HD STD TPR HUM TI RVRS (Orthopedic Implant) ×1 IMPLANT
BLADE HEX COATED 2.75 (ELECTRODE) ×2 IMPLANT
BLADE SAW SGTL MED 73X18.5 STR (BLADE) ×2 IMPLANT
BLADE SURG 10 STRL SS (BLADE) IMPLANT
BLADE SURG 15 STRL LF DISP TIS (BLADE) IMPLANT
BLADE SURG 15 STRL SS (BLADE)
BOWL SMART MIX CTS (DISPOSABLE) IMPLANT
CEMENT BONE R 1X40 (Cement) ×2 IMPLANT
CLSR STERI-STRIP ANTIMIC 1/2X4 (GAUZE/BANDAGES/DRESSINGS) ×2 IMPLANT
COOLER ICEMAN CLASSIC (MISCELLANEOUS) ×2 IMPLANT
COVER BACK TABLE 60X90IN (DRAPES) ×2 IMPLANT
COVER MAYO STAND STRL (DRAPES) ×2 IMPLANT
COVER WAND RF STERILE (DRAPES) IMPLANT
DERMABOND ADVANCED (GAUZE/BANDAGES/DRESSINGS)
DERMABOND ADVANCED .7 DNX12 (GAUZE/BANDAGES/DRESSINGS) IMPLANT
DRAPE IMP U-DRAPE 54X76 (DRAPES) ×2 IMPLANT
DRAPE SURG 17X23 STRL (DRAPES) ×2 IMPLANT
DRAPE U-SHAPE 47X51 STRL (DRAPES) ×2 IMPLANT
DRAPE U-SHAPE 76X120 STRL (DRAPES) ×4 IMPLANT
DRSG AQUACEL AG ADV 3.5X 6 (GAUZE/BANDAGES/DRESSINGS) IMPLANT
DRSG MEPILEX BORDER 4X8 (GAUZE/BANDAGES/DRESSINGS) ×2 IMPLANT
DURAPREP 26ML APPLICATOR (WOUND CARE) ×2 IMPLANT
ELECT BLADE 6.5 EXT (BLADE) IMPLANT
ELECT REM PT RETURN 9FT ADLT (ELECTROSURGICAL) ×2
ELECTRODE REM PT RTRN 9FT ADLT (ELECTROSURGICAL) ×1 IMPLANT
GLENOID PEGGED HYBRID SM 4MM (Orthopedic Implant) ×2 IMPLANT
GLENOID POST REGENREX HYBRID (Orthopedic Implant) ×2 IMPLANT
GLOVE BIO SURGEON STRL SZ 6.5 (GLOVE) ×2 IMPLANT
GLOVE BIO SURGEON STRL SZ7 (GLOVE) ×2 IMPLANT
GLOVE BIOGEL M 6.5 STRL (GLOVE) ×4 IMPLANT
GLOVE BIOGEL PI IND STRL 7.0 (GLOVE) ×3 IMPLANT
GLOVE BIOGEL PI IND STRL 8 (GLOVE) ×1 IMPLANT
GLOVE BIOGEL PI INDICATOR 7.0 (GLOVE) ×3
GLOVE BIOGEL PI INDICATOR 8 (GLOVE) ×1
GLOVE ORTHO TXT STRL SZ7.5 (GLOVE) ×2 IMPLANT
GLOVE SURG SS PI 7.0 STRL IVOR (GLOVE) ×2 IMPLANT
GOWN STRL REUS W/ TWL LRG LVL3 (GOWN DISPOSABLE) ×4 IMPLANT
GOWN STRL REUS W/ TWL XL LVL3 (GOWN DISPOSABLE) ×1 IMPLANT
GOWN STRL REUS W/TWL LRG LVL3 (GOWN DISPOSABLE) ×8
GOWN STRL REUS W/TWL XL LVL3 (GOWN DISPOSABLE) ×2
HANDPIECE INTERPULSE COAX TIP (DISPOSABLE) ×2
HEAD HUMERAL BIPOLAR 38X19X39 (Miscellaneous) ×1 IMPLANT
HEAD HUMERAL COMP STD (Orthopedic Implant) ×1 IMPLANT
HOOD PEEL AWAY FLYTE STAYCOOL (MISCELLANEOUS) ×6 IMPLANT
HUMERAL HEAD BIPOLAR 38X19X39 (Miscellaneous) ×2 IMPLANT
HUMERAL HEAD COMP STD (Orthopedic Implant) ×2 IMPLANT
KIT TURNOVER KIT B (KITS) ×2 IMPLANT
MANIFOLD NEPTUNE II (INSTRUMENTS) ×2 IMPLANT
NS IRRIG 1000ML POUR BTL (IV SOLUTION) ×2 IMPLANT
PACK BASIN DAY SURGERY FS (CUSTOM PROCEDURE TRAY) ×2 IMPLANT
PACK SHOULDER (CUSTOM PROCEDURE TRAY) ×2 IMPLANT
PAD COLD SHLDR WRAP-ON (PAD) ×2 IMPLANT
PIN HUMERAL STMN 3.2MMX9IN (INSTRUMENTS) ×2 IMPLANT
RETRIEVER SUT HEWSON (MISCELLANEOUS) IMPLANT
SET HNDPC FAN SPRY TIP SCT (DISPOSABLE) ×1 IMPLANT
SHEET MEDIUM DRAPE 40X70 STRL (DRAPES) ×2 IMPLANT
SLEEVE SCD COMPRESS KNEE MED (MISCELLANEOUS) ×2 IMPLANT
SLING ARM FOAM STRAP LRG (SOFTGOODS) ×2 IMPLANT
SLING ARM IMMOBILIZER LRG (SOFTGOODS) IMPLANT
SLING ARM IMMOBILIZER MED (SOFTGOODS) IMPLANT
SMARTMIX MINI TOWER (MISCELLANEOUS) ×2
SPONGE LAP 18X18 RF (DISPOSABLE) ×2 IMPLANT
SPONGE LAP 4X18 RFD (DISPOSABLE) IMPLANT
STEM HUMERAL STRL 12MMX14MM (Stem) ×2 IMPLANT
SUCTION FRAZIER HANDLE 10FR (MISCELLANEOUS) ×1
SUCTION TUBE FRAZIER 10FR DISP (MISCELLANEOUS) ×1 IMPLANT
SUPPORT WRAP ARM LG (MISCELLANEOUS) ×2 IMPLANT
SUT FIBERWIRE #2 38 REV NDL BL (SUTURE) ×12
SUT FIBERWIRE #2 38 T-5 BLUE (SUTURE)
SUT MNCRL AB 4-0 PS2 18 (SUTURE) ×2 IMPLANT
SUT VIC AB 0 CT1 27 (SUTURE) ×2
SUT VIC AB 0 CT1 27XBRD ANBCTR (SUTURE) ×1 IMPLANT
SUT VIC AB 2-0 CT1 27 (SUTURE) ×2
SUT VIC AB 2-0 CT1 TAPERPNT 27 (SUTURE) ×1 IMPLANT
SUT VIC AB 2-0 SH 27 (SUTURE)
SUT VIC AB 2-0 SH 27XBRD (SUTURE) IMPLANT
SUTURE FIBERWR #2 38 T-5 BLUE (SUTURE) IMPLANT
SUTURE FIBERWR#2 38 REV NDL BL (SUTURE) ×6 IMPLANT
SYR TOOMEY IRRIG 70ML (MISCELLANEOUS) ×4
SYRINGE TOOMEY IRRIG 70ML (MISCELLANEOUS) ×2 IMPLANT
TAPE STRIPS DRAPE STRL (GAUZE/BANDAGES/DRESSINGS) IMPLANT
TOWEL GREEN STERILE FF (TOWEL DISPOSABLE) ×8 IMPLANT
TOWER SMARTMIX MINI (MISCELLANEOUS) ×1 IMPLANT
TUBE CONNECTING 20X1/4 (TUBING) IMPLANT
WATER STERILE IRR 1000ML POUR (IV SOLUTION) ×2 IMPLANT
YANKAUER SUCT BULB TIP NO VENT (SUCTIONS) IMPLANT

## 2020-06-21 NOTE — Discharge Instructions (Signed)
Diet: As you were doing prior to hospitalization   Shower:  May shower but keep the wounds dry, use an occlusive plastic wrap, NO SOAKING IN TUB.  If the bandage gets wet, change with a clean dry gauze.    Dressing:  You may change your dressing 3-5 days after surgery, unless you have a splint.  If you have a splint, then just leave the splint in place and we will change your bandages during your first follow-up appointment.    If you had hand or foot surgery, we will plan to remove your stitches in about 2 weeks in the office.  For all other surgeries, there are sticky tapes (steri-strips) on your wounds and all the stitches are absorbable.  Leave the steri-strips in place when changing your dressings, they will peel off with time, usually 2-3 weeks.  Activity:  Increase activity slowly as tolerated, but follow the weight bearing instructions below.  The rules on driving is that you can not be taking narcotics while you drive, and you must feel in control of the vehicle.    Weight Bearing:   Do not bear weight with operative arm, keep sling on at all times, however, can remove for hygiene  To prevent constipation: you may use a stool softener such as -  Colace (over the counter) 100 mg by mouth twice a day  Drink plenty of fluids (prune juice may be helpful) and high fiber foods Miralax (over the counter) for constipation as needed.    Itching:  If you experience itching with your medications, try taking only a single pain pill, or even half a pain pill at a time.  You may take up to 10 pain pills per day, and you can also use benadryl over the counter for itching or also to help with sleep.   Precautions:  If you experience chest pain or shortness of breath - call 911 immediately for transfer to the hospital emergency department!!  If you develop a fever greater that 101 F, purulent drainage from wound, increased redness or drainage from wound, or calf pain -- Call the office at [phone]                                                 Follow- Up Appointment:  Please call for an appointment to be seen in 2 weeks [place] - [phone]  Dental Antibiotics:  In most cases prophylactic antibiotics for Dental procdeures after total joint surgery are not necessary.  Exceptions are as follows:  1. History of prior total joint infection  2. Severely immunocompromised (Organ Transplant, cancer chemotherapy, Rheumatoid biologic meds such as Humera)  3. Poorly controlled diabetes (A1C &gt; 8.0, blood glucose over 200)  If you have one of these conditions, contact your surgeon for an antibiotic prescription, prior to your dental procedure.  No Tylenol until after 3pm today if needed  Post Anesthesia Home Care Instructions  Activity: Get plenty of rest for the remainder of the day. A responsible individual must stay with you for 24 hours following the procedure.  For the next 24 hours, DO NOT: -Drive a car -Advertising copywriter -Drink alcoholic beverages -Take any medication unless instructed by your physician -Make any legal decisions or sign important papers.  Meals: Start with liquid foods such as gelatin or soup. Progress to regular foods as tolerated. Avoid  greasy, spicy, heavy foods. If nausea and/or vomiting occur, drink only clear liquids until the nausea and/or vomiting subsides. Call your physician if vomiting continues.  Special Instructions/Symptoms: Your throat may feel dry or sore from the anesthesia or the breathing tube placed in your throat during surgery. If this causes discomfort, gargle with warm salt water. The discomfort should disappear within 24 hours.  If you had a scopolamine patch placed behind your ear for the management of post- operative nausea and/or vomiting:  1. The medication in the patch is effective for 72 hours, after which it should be removed.  Wrap patch in a tissue and discard in the trash. Wash hands thoroughly with soap and  water. 2. You may remove the patch earlier than 72 hours if you experience unpleasant side effects which may include dry mouth, dizziness or visual disturbances. 3. Avoid touching the patch. Wash your hands with soap and water after contact with the patch.  Regional Anesthesia Blocks  1. Numbness or the inability to move the "blocked" extremity may last from 3-48 hours after placement. The length of time depends on the medication injected and your individual response to the medication. If the numbness is not going away after 48 hours, call your surgeon.  2. The extremity that is blocked will need to be protected until the numbness is gone and the  Strength has returned. Because you cannot feel it, you will need to take extra care to avoid injury. Because it may be weak, you may have difficulty moving it or using it. You may not know what position it is in without looking at it while the block is in effect.  3. For blocks in the legs and feet, returning to weight bearing and walking needs to be done carefully. You will need to wait until the numbness is entirely gone and the strength has returned. You should be able to move your leg and foot normally before you try and bear weight or walk. You will need someone to be with you when you first try to ensure you do not fall and possibly risk injury.  4. Bruising and tenderness at the needle site are common side effects and will resolve in a few days.  5. Persistent numbness or new problems with movement should be communicated to the surgeon or the [facility] [phone] [facility] [phone]).

## 2020-06-21 NOTE — Anesthesia Procedure Notes (Signed)
Procedure Name: Intubation Date/Time: 06/21/2020 10:37 AM Performed by: Talbot Grumbling, CRNA Pre-anesthesia Checklist: Patient identified, Emergency Drugs available, Suction available and Patient being monitored Patient Re-evaluated:Patient Re-evaluated prior to induction Oxygen Delivery Method: Circle system utilized Preoxygenation: Pre-oxygenation with 100% oxygen Induction Type: IV induction Ventilation: Mask ventilation without difficulty Laryngoscope Size: Mac and 3 Grade View: Grade I Tube type: Oral Tube size: 7.0 mm Number of attempts: 1 Airway Equipment and Method: Stylet Placement Confirmation: ETT inserted through vocal cords under direct vision,  positive ETCO2 and breath sounds checked- equal and bilateral Secured at: 21 cm Tube secured with: Tape Dental Injury: Teeth and Oropharynx as per pre-operative assessment

## 2020-06-21 NOTE — Progress Notes (Signed)
Assisted Dr. Miller with right, ultrasound guided, interscalene  block. Side rails up, monitors on throughout procedure. See vital signs in flow sheet. Tolerated Procedure well. 

## 2020-06-21 NOTE — Anesthesia Procedure Notes (Signed)
Anesthesia Regional Block: Interscalene brachial plexus block   Pre-Anesthetic Checklist: ,, timeout performed, Correct Patient, Correct Site, Correct Laterality, Correct Procedure, Correct Position, site marked, Risks and benefits discussed,  Surgical consent,  Pre-op evaluation,  At surgeon's request and post-op pain management  Laterality: Right  Prep: chloraprep       Needles:  Injection technique: Single-shot  Needle Type: Stimiplex     Needle Length: 9cm  Needle Gauge: 21     Additional Needles:   Procedures:,,,, ultrasound used (permanent image in chart),,,,  Narrative:  Start time: 06/21/2020 9:57 AM End time: 06/21/2020 10:02 AM Injection made incrementally with aspirations every 5 mL.  Performed by: Personally  Anesthesiologist: Lowella Curb, MD

## 2020-06-21 NOTE — Transfer of Care (Signed)
Immediate Anesthesia Transfer of Care Note  Patient: Jennifer Baxter  Procedure(s) Performed: TOTAL SHOULDER ARTHROPLASTY (Right Shoulder)  Patient Location: PACU  Anesthesia Type:General  Level of Consciousness: awake, alert  and oriented  Airway & Oxygen Therapy: Patient Spontanous Breathing and Patient connected to face mask oxygen  Post-op Assessment: Report given to RN and Post -op Vital signs reviewed and stable  Post vital signs: Reviewed and stable  Last Vitals:  Vitals Value Taken Time  BP    Temp    Pulse 70 06/21/20 1248  Resp 17 06/21/20 1248  SpO2 100 % 06/21/20 1248  Vitals shown include unvalidated device data.  Last Pain:  Vitals:   06/21/20 0821  TempSrc: Oral  PainSc: 5       Patients Stated Pain Goal: 6 (06/21/20 1937)  Complications: No complications documented.

## 2020-06-21 NOTE — Anesthesia Postprocedure Evaluation (Signed)
Anesthesia Post Note  Patient: Jennifer Baxter  Procedure(s) Performed: TOTAL SHOULDER ARTHROPLASTY (Right Shoulder)     Patient location during evaluation: PACU Anesthesia Type: General Level of consciousness: awake and alert Pain management: pain level controlled Vital Signs Assessment: post-procedure vital signs reviewed and stable Respiratory status: spontaneous breathing, nonlabored ventilation and respiratory function stable Cardiovascular status: blood pressure returned to baseline and stable Postop Assessment: no apparent nausea or vomiting Anesthetic complications: no   No complications documented.  Last Vitals:  Vitals:   06/21/20 1330 06/21/20 1419  BP: (!) 104/55 95/65  Pulse: 62 68  Resp: 12 18  Temp:  36.8 C  SpO2: 100% 95%    Last Pain:  Vitals:   06/21/20 1419  TempSrc: Oral  PainSc: 0-No pain                 Lowella Curb

## 2020-06-21 NOTE — H&P (Signed)
PREOPERATIVE H&P  Chief Complaint: right shoulder pain  HPI: Jennifer Baxter is a 69 y.o. female who presents for preoperative history and physical with a diagnosis of right shoulder osteoarthritis. Symptoms are rated as moderate to severe, and have been worsening.  This is significantly impairing activities of daily living.  She has elected for surgical management.   She has failed injections, activity modification, anti-inflammatories, and assistive devices.  Preoperative X-rays demonstrate end stage degenerative changes with osteophyte formation, loss of joint space, subchondral sclerosis.   Past Medical History:  Diagnosis Date  . Arrhythmia   . Arthritis   . Complication of anesthesia   . Degenerative joint disease (DJD) of lumbar spine   . Dieulafoy's vascular malformation    acute GI bleed 02-10-20  . DJD (degenerative joint disease)   . Dysrhythmia    chronic PVC's  . GERD (gastroesophageal reflux disease)   . GI bleed   . HOCM (hypertrophic obstructive cardiomyopathy) (HCC)   . Hypertension   . Obesity   . PONV (postoperative nausea and vomiting)   . PVC's (premature ventricular contractions)    Past Surgical History:  Procedure Laterality Date  . ABDOMINAL HYSTERECTOMY    . BACK SURGERY    . BIOPSY  02/11/2020   Procedure: BIOPSY;  Surgeon: Rachael Fee, MD;  Location: [facility] ENDOSCOPY;  Service: Endoscopy;;  . CERVICAL DISCECTOMY    . CERVICAL FUSION    . CHOLECYSTECTOMY    . ESOPHAGOGASTRODUODENOSCOPY (EGD) WITH PROPOFOL N/A 02/11/2020   Procedure: ESOPHAGOGASTRODUODENOSCOPY (EGD) WITH PROPOFOL;  Surgeon: Rachael Fee, MD;  Location: [facility] ENDOSCOPY;  Service: Endoscopy;  Laterality: N/A;  . LUMBAR DISC SURGERY    . MENISCECTOMY    . THORACIC DISCECTOMY    . THORACIC FUSION     Social History   Socioeconomic History  . Marital status: Married    Spouse name: Not on file  . Number of children: Not on file  . Years of education: Not on file  . Highest  education level: Not on file  Occupational History  . Not on file  Tobacco Use  . Smoking status: Never Smoker  . Smokeless tobacco: Never Used  Vaping Use  . Vaping Use: Never used  Substance and Sexual Activity  . Alcohol use: Yes    Comment: social  . Drug use: No  . Sexual activity: Not on file  Other Topics Concern  . Not on file  Social History Narrative  . Not on file   Social Determinants of Health   Financial Resource Strain:   . Difficulty of Paying Living Expenses: Not on file  Food Insecurity:   . Worried About Programme researcher, broadcasting/film/video in the Last Year: Not on file  . Ran Out of Food in the Last Year: Not on file  Transportation Needs:   . Lack of Transportation (Medical): Not on file  . Lack of Transportation (Non-Medical): Not on file  Physical Activity:   . Days of Exercise per Week: Not on file  . Minutes of Exercise per Session: Not on file  Stress:   . Feeling of Stress : Not on file  Social Connections:   . Frequency of Communication with Friends and Family: Not on file  . Frequency of Social Gatherings with Friends and Family: Not on file  . Attends Religious Services: Not on file  . Active Member of Clubs or Organizations: Not on file  . Attends Banker Meetings: Not on file  . Marital  Status: Not on file   Family History  Problem Relation Age of Onset  . Arthritis Mother   . Diabetes Mellitus I Mother   . Hypertension Mother   . Arthritis Father   . Hypertension Father   . Arthritis Sister   . Hypertension Sister   . Stroke Sister   . Alcoholism Brother   . Colon cancer Brother   . Hypertension Brother   . Diabetes Mellitus I Maternal Grandmother   . Heart disease Paternal Grandmother   . Hypertension Paternal Grandfather   . Stroke Paternal Grandfather   . Breast cancer Neg Hx    Allergies  Allergen Reactions  . Codeine Hives, Itching and Rash   Prior to Admission medications   Medication Sig Start Date End Date Taking?  Authorizing Provider  amLODipine (NORVASC) 5 MG tablet Take 5 mg by mouth daily.   Yes [provider]  metoprolol succinate (TOPROL-XL) 100 MG 24 hr tablet Take 100 mg by mouth in the morning and at bedtime. Take with or immediately following a meal.    Yes [provider]  pantoprazole (PROTONIX) 40 MG tablet Take 1 tablet (40 mg total) by mouth 2 (two) times daily before a meal. 02/12/20 06/15/20 Yes Swayze, Ava, DO  sertraline (ZOLOFT) 50 MG tablet Take 50 mg by mouth daily.   Yes [provider]     Positive ROS: All other systems have been reviewed and were otherwise negative with the exception of those mentioned in the HPI and as above.  Physical Exam: General: Alert, no acute distress Cardiovascular: No pedal edema Respiratory: No cyanosis, no use of accessory musculature GI: No organomegaly, abdomen is soft and non-tender Skin: No lesions in the area of chief complaint Neurologic: Sensation intact distally Psychiatric: Patient is competent for consent with normal mood and affect Lymphatic: No axillary or cervical lymphadenopathy  MUSCULOSKELETAL: right shoulder AROM 0-90 degrees with crepitance and painful arc.  0 ER.   Assessment: Right shoulder glenohumeral osteoarthritis.   Plan: Plan for Procedure(s): TOTAL SHOULDER ARTHROPLASTY  The risks benefits and alternatives were discussed with the patient including but not limited to the risks of nonoperative treatment, versus surgical intervention including infection, bleeding, nerve injury,  blood clots, cardiopulmonary complications, morbidity, mortality, among others, and they were willing to proceed.    Patient's anticipated LOS is less than 2 midnights, meeting these requirements: - Younger than 35 - Lives within 1 hour of care - Has a competent adult at home to recover with post-op recover - NO history of  - Chronic pain requiring opiods  - Diabetes  - Coronary Artery Disease  - Heart  failure  - Heart attack  - Stroke  - DVT/VTE  - Cardiac arrhythmia  - Respiratory Failure/COPD  - Renal failure  - Anemia  - Advanced Liver disease        Eulas Post, MD Cell [phone]   06/21/2020 10:02 AM

## 2020-06-21 NOTE — Anesthesia Preprocedure Evaluation (Signed)
Anesthesia Evaluation  Patient identified by MRN, date of birth, ID band Patient awake    Reviewed: Allergy & Precautions, H&P , NPO status , Patient's Chart, lab work & pertinent test results  History of Anesthesia Complications (+) PONV  Airway Mallampati: II  TM Distance: >3 FB Neck ROM: full    Dental no notable dental hx.    Pulmonary neg pulmonary ROS,    Pulmonary exam normal breath sounds clear to auscultation       Cardiovascular hypertension, Pt. on medications Normal cardiovascular exam Rhythm:regular Rate:Normal  HOCM   Neuro/Psych    GI/Hepatic GERD  ,  Endo/Other    Renal/GU      Musculoskeletal  (+) Arthritis ,   Abdominal   Peds  Hematology  (+) Blood dyscrasia, anemia ,   Anesthesia Other Findings   Reproductive/Obstetrics                             Anesthesia Physical  Anesthesia Plan  ASA: III  Anesthesia Plan: General   Post-op Pain Management:  Regional for Post-op pain   Induction: Intravenous  PONV Risk Score and Plan: 2 and 4 or greater and Treatment may vary due to age or medical condition, Ondansetron, Dexamethasone, Midazolam and Droperidol  Airway Management Planned: Oral ETT  Additional Equipment:   Intra-op Plan:   Post-operative Plan: Extubation in OR  Informed Consent: I have reviewed the patients History and Physical, chart, labs and discussed the procedure including the risks, benefits and alternatives for the proposed anesthesia with the patient or authorized representative who has indicated his/her understanding and acceptance.       Plan Discussed with: CRNA, Anesthesiologist and Surgeon  Anesthesia Plan Comments:         Anesthesia Quick Evaluation

## 2020-06-21 NOTE — Op Note (Signed)
06/21/2020  12:33 PM  PATIENT:  Jennifer Baxter    PRE-OPERATIVE DIAGNOSIS: Right shoulder primary localized osteoarthritis  POST-OPERATIVE DIAGNOSIS:  Same  PROCEDURE: RIGHT total Shoulder Arthroplasty  SURGEON:  Eulas Post, MD  PHYSICIAN ASSISTANT: Janine Ores, PA-C, present and scrubbed throughout the case, critical for completion in a timely fashion, and for retraction, instrumentation, and closure.  ANESTHESIA:   General with an interscalene block  ESTIMATED BLOOD LOSS: 150 mL  UNIQUE ASPECTS OF THE CASE: The glenohumeral articular wear was fairly superior, the cuff was intact, I scrutinized this, although it was somewhat hypermobile, and I did encounter a blush of joint fluid when first approaching the biceps tendon, not sure if this was from a small rent somewhere not visible in the cuff, or if I simply encountered bursal fluid.  Nonetheless the supraspinatus and subscapularis and infraspinatus all appeared intact.  My central pin was exactly to the depth of the vault, it was still contained, but just at the very depth.  The pin came out after preparation of the ream.  I had all holes contained within the vault.  I debated between using the 38 and the 42 head, the 42 was just slightly too large, the 38 was probably matching her soft tissue anatomy the best.  The offset was posterior inferior.  I used a size 12 stem, although reamed to a size 13, but the 12 broach was tight, and in fact the real prosthesis sat just a millimeter proud.  PREOPERATIVE INDICATIONS:  Jennifer Baxter is a  69 y.o. female who failed conservative measures and elected for surgical management.    The risks benefits and alternatives were discussed with the patient preoperatively including but not limited to the risks of infection, bleeding, nerve injury, cardiopulmonary complications, the need for revision surgery, dislocation, loosening, incomplete relief of pain, among others, and the patient was willing  to proceed.   OPERATIVE IMPLANTS: Biomet size 12 micro press-fit humeral stem, size 38+19 versa-dial humeral head, set in the ED position with increased coverage posteriorly inferiorly, with a small cemented glenoid polyethylene 3 peg implant with a central regenerex noncemented post.   OPERATIVE FINDINGS: Advanced glenohumeral osteoarthritis involving the glenoid and the humeral head with some osteophyte formation inferiorly.   OPERATIVE PROCEDURE: The patient was brought to the operating room and placed in the supine position. General anesthesia was administered. IV antibiotics were given.  The upper extremity was prepped and draped in usual sterile fashion. The patient was in a beachchair position with all bony prominences padded.   Time out was performed and a deltopectoral approach was carried out. The biceps tendon was tenodesed to the pectoralis tendon. The subscapularis was released, tagging it with a #2 FiberWire, leaving a cuff of tendon for repair.   The inferior osteophyte was removed, and release of the capsule off of the humeral side was completed. The head was dislocated, and I reamed sequentially. I placed the humeral cutting guide at 30 of retroversion, and then pinned this into place, and made my humeral neck cut. This was at the appropriate level.   I then placed deep retractors and exposed the glenoid. I excised the labrum circumferentially, taking care to protect the axillary nerve inferiorly.   I then placed a guidewire into the center position, controlling appropriate version and inclination. I then reamed over the guidewire with the small reamer, and was satisfied with the preparation. I preserved the subchondral bone in order to maximize the strength and minimize the  risk for subsequent subsidence.   I then drilled the central hole for the regenerex peg, and then placed the guide, and then drilled the 3 peripheral peg holes. I had excellent bony circumferential contact.    I then cleaned the glenoid, irrigated it copiously, and then dried it and cemented the prosthesis into place. Excellent seating was achieved. I had full exposure. The cement cured while I turned my attention to the humeral side.   I sequentially broached, up to the selected size, with the broach set at 30 of retroversion. I placed 3 #2 Fiberwire through the bone for subsequent repair.  I then placed the real stem. I trialed with multiple heads, and the above-named component was selected. Increased posterior coverage improved the coverage. The soft tissue tension was appropriate.   I then impacted the real humeral head into place, reduced the head, and irrigated copiously. Excellent stability and range of motion was achieved. I repaired the subscapularis with a total of 5 #2 FiberWire; one for the interval, one for the corner, and then the remaining three from the lesser tuberosity which had already been passed.  Excellent repair achieved and I irrigated copiously once more. The subcutaneous tissue was closed with Vicryl including the deltopectoral fascia.   The skin was closed with Steri-Strips and sterile gauze was applied. She had a preoperative nerve block. She tolerated the procedure well and there were no complications.

## 2020-06-23 ENCOUNTER — Encounter (HOSPITAL_BASED_OUTPATIENT_CLINIC_OR_DEPARTMENT_OTHER): Payer: Self-pay | Admitting: Orthopedic Surgery

## 2020-07-25 ENCOUNTER — Other Ambulatory Visit: Payer: Self-pay | Admitting: Family Medicine

## 2020-07-25 DIAGNOSIS — M25512 Pain in left shoulder: Secondary | ICD-10-CM

## 2020-08-03 ENCOUNTER — Telehealth: Payer: Self-pay | Admitting: *Deleted

## 2020-08-03 NOTE — Telephone Encounter (Signed)
   [facility] Pre-operative Risk Assessment    HEARTCARE STAFF: - Please ensure there is not already an duplicate clearance open for this procedure. - Under Visit Info/Reason for Call, type in Other and utilize the format Clearance MM/DD/YY or Clearance TBD. Do not use dashes or single digits. - If request is for dental extraction, please clarify the # of teeth to be extracted.  Request for surgical clearance:  1. What type of surgery is being performed? Left total shoulder replacement   2. When is this surgery scheduled? TBD   3. What type of clearance is required (medical clearance vs. Pharmacy clearance to hold med vs. Both)? Medical  4. Are there any medications that need to be held prior to surgery and how long?None listed   5. Practice name and name of physician performing surgery? [place], Dr Marchia Bond   6. What is the office phone number? [phone])   7.   What is the office fax number? [place]  8.   Anesthesia type (None, local, MAC, general) ? Not listed   Juventino Slovak 08/03/2020, 8:42 AM  _________________________________________________________________   (provider comments below)

## 2020-08-03 NOTE — Telephone Encounter (Signed)
   Primary Cardiologist: Previously seen by Dr. Elberta Fortis 05/2019.  Chart reviewed as part of pre-operative protocol coverage. Because of Jennifer Baxter's past medical history and time since last visit, she will require a follow-up visit in order to better assess preoperative cardiovascular risk.  Pre-op covering staff: - Please schedule appointment and call patient to inform them. Please add "pre-op clearance" to the appointment notes so provider is aware. - Please contact requesting surgeon's office via preferred method (i.e, phone, fax) to inform them of need for appointment prior to surgery.  [name], PA-C  08/03/2020, 1:38 PM

## 2020-08-04 ENCOUNTER — Other Ambulatory Visit: Payer: Self-pay | Admitting: Orthopedic Surgery

## 2020-08-04 DIAGNOSIS — M25512 Pain in left shoulder: Secondary | ICD-10-CM

## 2020-08-04 NOTE — Telephone Encounter (Signed)
Will send message to EP scheduler [place] to reach to the pt with a pre op appt.

## 2020-08-05 NOTE — Telephone Encounter (Signed)
Hello, she is the one [place] left a message for. 08/04/20 9:19 am LMTCB to schedule pre op clearance appointment with Uvaldo Rising on 08/12/20...ket. I will call her again today I called and left a message on her home and cell phone. Will wait for her to call back  See message above from EP scheduler [place].

## 2020-08-08 NOTE — Telephone Encounter (Signed)
Our EP [facility], s/w the pt today in regards to appt for pre op. [place], updated me to her conversation with the pt. Per the pt to [facility] she does not need an appt with our office. Her regular cardiologist is Dr. Carleene Mains who is not with our practice. Pt said she only saw Dr. Elberta Fortis in an emergency. Pt said she will [facility] office know as well. I assured [place], EP Scheduler I will make a note of this conversation and send notes to [employer]. Will remove from the pre op call back pool.

## 2020-08-09 ENCOUNTER — Ambulatory Visit
Admission: RE | Admit: 2020-08-09 | Discharge: 2020-08-09 | Disposition: A | Discharge status: Home / Self Care | Payer: Medicare Other | Source: Ambulatory Visit | Attending: Family Medicine | Admitting: Family Medicine

## 2020-08-09 DIAGNOSIS — M25512 Pain in left shoulder: Secondary | ICD-10-CM

## 2020-08-26 DIAGNOSIS — M19012 Primary osteoarthritis, left shoulder: Secondary | ICD-10-CM | POA: Diagnosis present

## 2020-09-22 ENCOUNTER — Encounter (HOSPITAL_COMMUNITY): Admission: RE | Admit: 2020-09-22 | Payer: Medicare Other | Source: Ambulatory Visit

## 2020-09-28 NOTE — Patient Instructions (Addendum)
DUE TO COVID-19 ONLY ONE VISITOR IS ALLOWED TO COME WITH YOU AND STAY IN THE WAITING ROOM ONLY DURING PRE OP AND PROCEDURE.   IF YOU WILL BE ADMITTED INTO THE HOSPITAL YOU ARE ALLOWED ONE SUPPORT PERSON DURING VISITATION HOURS ONLY (10AM -8PM)   . The support person may change daily. . The support person must pass our screening, gel in and out, and wear a mask at all times, including in the patient's room. . Patients must also wear a mask when staff or their support person are in the room.   COVID SWAB TESTING MUST BE COMPLETED ON:   Friday, 09-30-20 @8 :00 AM   [address] [place], [place]  (Must self quarantine after testing. Follow instructions on handout.)    Your procedure is scheduled on:  Tuesday, 10-04-20   Report to [facility] Main  Entrance    Report to admitting at 6:30 AM   Call this number if you have problems the morning of surgery [phone]   Do not eat food :After Midnight.   May have liquids until 6:00 AM  day of surgery  CLEAR LIQUID DIET  Foods Allowed                                                                     Foods Excluded  Water, Black Coffee and tea, regular and decaf              liquids that you cannot  Plain Jell-O in any flavor  (No red)                                     see through such as: Fruit ices (not with fruit pulp)                                      milk, soups, orange juice              Iced Popsicles (No red)                                      All solid food                                   Apple juices Sports drinks like Gatorade (No red) Lightly seasoned clear broth or consume(fat free) Sugar, honey syrup   Complete one Ensure drink the morning of surgery at  6:00 AM  the day of surgery.     Oral Hygiene is also important to reduce your risk of infection.                                    Remember - BRUSH YOUR TEETH THE MORNING OF SURGERY WITH YOUR REGULAR TOOTHPASTE   Do NOT smoke after  Midnight   Take these medicines the morning of  surgery with A SIP OF WATER: Amlodipine, Metoprolol, Pantoprazole                                You may not have any metal on your body including hair pins, jewelry, and body piercings             Do not wear make-up, lotions, powders, perfumes/cologne, or deodorant             Do not wear nail polish.  Do not shave  48 hours prior to surgery.                Do not bring valuables to the hospital. [facility] IS NOT RESPONSIBLE   FOR VALUABLES.   Contacts, dentures or bridgework may not be worn into surgery.   Bring small overnight bag day of surgery.     Special Instructions: Bring a copy of your healthcare power of attorney and living will documents         the day of surgery if you haven't scanned them in before.              Please read over the following fact sheets you were given: IF YOU HAVE QUESTIONS ABOUT YOUR PRE OP INSTRUCTIONS PLEASE CALL [phone]  [place]- Preparing for Total Shoulder Arthroplasty    Before surgery, you can play an important role. Because skin is not sterile, your skin needs to be as free of germs as possible. You can reduce the number of germs on your skin by using the following products. . Benzoyl Peroxide Gel o Reduces the number of germs present on the skin o Applied twice a day to shoulder area starting two days before surgery    ==================================================================  Please follow these instructions carefully:  BENZOYL PEROXIDE 5% GEL  Please do not use if you have an allergy to benzoyl peroxide.   If your skin becomes reddened/irritated stop using the benzoyl peroxide.  Starting two days before surgery, apply as follows: 1. Apply benzoyl peroxide in the morning and at night. Apply after taking a shower. If you are not taking a shower clean entire shoulder front, back, and side along with the armpit with a clean wet washcloth.  2. Place a quarter-sized dollop  on your shoulder and rub in thoroughly, making sure to cover the front, back, and side of your shoulder, along with the armpit.   2 days before ____ AM   ____ PM              1 day before ____ AM   ____ PM                         3. Do this twice a day for two days.  (Last application is the night before surgery, AFTER using the CHG soap as described below).  4. Do NOT apply benzoyl peroxide gel on the day of surgery.   [facility] - Preparing for Surgery Before surgery, you can play an important role.  Because skin is not sterile, your skin needs to be as free of germs as possible.  You can reduce the number of germs on your skin by washing with CHG (chlorahexidine gluconate) soap before surgery.  CHG is an antiseptic cleaner which kills germs and bonds with the skin to continue killing germs even after washing. Please DO NOT use if you have an  allergy to CHG or antibacterial soaps.  If your skin becomes reddened/irritated stop using the CHG and inform your nurse when you arrive at Short Stay. Do not shave (including legs and underarms) for at least 48 hours prior to the first CHG shower.  You may shave your face/neck.  Please follow these instructions carefully:  1.  Shower with CHG Soap the night before surgery and the  morning of surgery.  2.  If you choose to wash your hair, wash your hair first as usual with your normal  shampoo.  3.  After you shampoo, rinse your hair and body thoroughly to remove the shampoo.                             4.  Use CHG as you would any other liquid soap.  You can apply chg directly to the skin and wash.  Gently with a scrungie or clean washcloth.  5.  Apply the CHG Soap to your body ONLY FROM THE NECK DOWN.   Do   not use on face/ open                           Wound or open sores. Avoid contact with eyes, ears mouth and   genitals (private parts).                       Wash face,  Genitals (private parts) with your normal soap.             6.  Wash  thoroughly, paying special attention to the area where your    surgery  will be performed.  7.  Thoroughly rinse your body with warm water from the neck down.  8.  DO NOT shower/wash with your normal soap after using and rinsing off the CHG Soap.                9.  Pat yourself dry with a clean towel.            10.  Wear clean pajamas.            11.  Place clean sheets on your bed the night of your first shower and do not  sleep with pets. Day of Surgery : Do not apply any lotions/deodorants the morning of surgery.  Please wear clean clothes to the hospital/surgery center.  FAILURE TO FOLLOW THESE INSTRUCTIONS MAY RESULT IN THE CANCELLATION OF YOUR SURGERY  PATIENT SIGNATURE_________________________________  NURSE SIGNATURE__________________________________  ________________________________________________________________________   Jennifer Baxter  An incentive spirometer is a tool that can help keep your lungs clear and active. This tool measures how well you are filling your lungs with each breath. Taking long deep breaths may help reverse or decrease the chance of developing breathing (pulmonary) problems (especially infection) following:  A long period of time when you are unable to move or be active. BEFORE THE PROCEDURE   If the spirometer includes an indicator to show your best effort, your nurse or respiratory therapist will set it to a desired goal.  If possible, sit up straight or lean slightly forward. Try not to slouch.  Hold the incentive spirometer in an upright position. INSTRUCTIONS FOR USE  1. Sit on the edge of your bed if possible, or sit up as far as you can in bed or on a chair. 2. Hold the incentive spirometer in  an upright position. 3. Breathe out normally. 4. Place the mouthpiece in your mouth and seal your lips tightly around it. 5. Breathe in slowly and as deeply as possible, raising the piston or the ball toward the top of the column. 6. Hold your  breath for 3-5 seconds or for as long as possible. Allow the piston or ball to fall to the bottom of the column. 7. Remove the mouthpiece from your mouth and breathe out normally. 8. Rest for a few seconds and repeat Steps 1 through 7 at least 10 times every 1-2 hours when you are awake. Take your time and take a few normal breaths between deep breaths. 9. The spirometer may include an indicator to show your best effort. Use the indicator as a goal to work toward during each repetition. 10. After each set of 10 deep breaths, practice coughing to be sure your lungs are clear. If you have an incision (the cut made at the time of surgery), support your incision when coughing by placing a pillow or rolled up towels firmly against it. Once you are able to get out of bed, walk around indoors and cough well. You may stop using the incentive spirometer when instructed by your caregiver.  RISKS AND COMPLICATIONS  Take your time so you do not get dizzy or light-headed.  If you are in pain, you may need to take or ask for pain medication before doing incentive spirometry. It is harder to take a deep breath if you are having pain. AFTER USE  Rest and breathe slowly and easily.  It can be helpful to keep track of a log of your progress. Your caregiver can provide you with a simple table to help with this. If you are using the spirometer at home, follow these instructions: SEEK MEDICAL CARE IF:   You are having difficultly using the spirometer.  You have trouble using the spirometer as often as instructed.  Your pain medication is not giving enough relief while using the spirometer.  You develop fever of 100.5 F (38.1 C) or higher. SEEK IMMEDIATE MEDICAL CARE IF:   You cough up bloody sputum that had not been present before.  You develop fever of 102 F (38.9 C) or greater.  You develop worsening pain at or near the incision site. MAKE SURE YOU:   Understand these instructions.  Will watch  your condition.  Will get help right away if you are not doing well or get worse. Document Released: 01/21/2007 Document Revised: 12/03/2011 Document Reviewed: 03/24/2007 [facility] Patient Information 2014 [place], [place].   ________________________________________________________________________

## 2020-09-28 NOTE — Progress Notes (Addendum)
COVID Vaccine Completed: x2 Date COVID Vaccine completed:  07-27-20 COVID vaccine manufacturer: Pfizer      PCP - Reather Converse, MD [facility]) Cardiologist - Marcina Millard, MD.  Last OV 08-22-20 Electrophysiology - Loman Brooklyn, MD  Cardiac clearance in note dated 08-22-20 in Epic  Chest x-ray -  EKG - 02-10-20 in Epic Stress Test - 10-20-13 in Epic  ECHO - 05-22-19 in Epic Cardiac Cath -  Pacemaker/ICD device last checked: Holter Monitor - 10-19-13 in Epic  Sleep Study - N/A CPAP -   Fasting Blood Sugar - N/A Checks Blood Sugar _____ times a day  Blood Thinner Instructions:  N/A Aspirin Instructions: Last Dose:  Anesthesia review: Obstructive cardiomyopathy, PVCs,  HTN.  Pt unable to climb a flight of stairs due to knee pain and nerve damage in left leg.  Able to do housework without assistance.  Patient denies shortness of breath, fever, cough and chest pain at PAT appointment   Patient verbalized understanding of instructions that were given to them at the PAT appointment. Patient was also instructed that they will need to review over the PAT instructions again at home before surgery.

## 2020-09-29 ENCOUNTER — Encounter (HOSPITAL_COMMUNITY)
Admission: RE | Admit: 2020-09-29 | Discharge: 2020-09-29 | Disposition: A | Discharge status: Home / Self Care | Payer: Medicare Other | Source: Ambulatory Visit | Attending: Orthopedic Surgery | Admitting: Orthopedic Surgery

## 2020-09-29 ENCOUNTER — Other Ambulatory Visit: Payer: Self-pay

## 2020-09-29 ENCOUNTER — Encounter (HOSPITAL_COMMUNITY): Payer: Self-pay

## 2020-09-29 DIAGNOSIS — Z79899 Other long term (current) drug therapy: Secondary | ICD-10-CM | POA: Diagnosis not present

## 2020-09-29 DIAGNOSIS — Z96611 Presence of right artificial shoulder joint: Secondary | ICD-10-CM | POA: Insufficient documentation

## 2020-09-29 DIAGNOSIS — M19012 Primary osteoarthritis, left shoulder: Secondary | ICD-10-CM | POA: Insufficient documentation

## 2020-09-29 DIAGNOSIS — I10 Essential (primary) hypertension: Secondary | ICD-10-CM | POA: Diagnosis not present

## 2020-09-29 DIAGNOSIS — Z01812 Encounter for preprocedural laboratory examination: Secondary | ICD-10-CM | POA: Insufficient documentation

## 2020-09-29 DIAGNOSIS — I2510 Atherosclerotic heart disease of native coronary artery without angina pectoris: Secondary | ICD-10-CM | POA: Insufficient documentation

## 2020-09-29 DIAGNOSIS — I493 Ventricular premature depolarization: Secondary | ICD-10-CM | POA: Diagnosis not present

## 2020-09-29 LAB — BASIC METABOLIC PANEL
Anion gap: 9 (ref 5–15)
BUN: 20 mg/dL (ref 8–23)
CO2: 26 mmol/L (ref 22–32)
Calcium: 9.7 mg/dL (ref 8.9–10.3)
Chloride: 104 mmol/L (ref 98–111)
Creatinine, Ser: 1.01 mg/dL — ABNORMAL HIGH (ref 0.44–1.00)
GFR, Estimated: 60 mL/min — ABNORMAL LOW (ref >60)
Glucose, Bld: 102 mg/dL — ABNORMAL HIGH (ref 70–99)
Potassium: 4.9 mmol/L (ref 3.5–5.1)
Sodium: 139 mmol/L (ref 135–145)

## 2020-09-29 LAB — CBC
HCT: 36.4 % (ref 36.0–46.0)
Hemoglobin: 12 g/dL (ref 12.0–15.0)
MCH: 30.4 pg (ref 26.0–34.0)
MCHC: 33 g/dL (ref 30.0–36.0)
MCV: 92.2 fL (ref 80.0–100.0)
Platelets: 209 10*3/uL (ref 150–400)
RBC: 3.95 MIL/uL (ref 3.87–5.11)
RDW: 12.8 % (ref 11.5–15.5)
WBC: 7.5 10*3/uL (ref 4.0–10.5)
nRBC: 0 % (ref 0.0–0.2)

## 2020-09-29 LAB — SURGICAL PCR SCREEN
MRSA, PCR: NEGATIVE
Staphylococcus aureus: POSITIVE — AB

## 2020-09-29 NOTE — Progress Notes (Signed)
PCR results sent to Dr. Landau to review. 

## 2020-09-30 ENCOUNTER — Other Ambulatory Visit (HOSPITAL_COMMUNITY)
Admission: RE | Admit: 2020-09-30 | Discharge: 2020-09-30 | Disposition: A | Discharge status: Home / Self Care | Payer: Medicare Other | Source: Ambulatory Visit | Attending: Orthopedic Surgery | Admitting: Orthopedic Surgery

## 2020-09-30 DIAGNOSIS — Z01812 Encounter for preprocedural laboratory examination: Secondary | ICD-10-CM | POA: Diagnosis present

## 2020-09-30 DIAGNOSIS — Z20822 Contact with and (suspected) exposure to covid-19: Secondary | ICD-10-CM | POA: Diagnosis not present

## 2020-09-30 LAB — SARS CORONAVIRUS 2 (TAT 6-24 HRS): SARS Coronavirus 2: NEGATIVE

## 2020-09-30 NOTE — Anesthesia Preprocedure Evaluation (Addendum)
Anesthesia Evaluation  Patient identified by MRN, date of birth, ID band Patient awake    Reviewed: Allergy & Precautions, H&P , NPO status , Patient's Chart, lab work & pertinent test results  History of Anesthesia Complications (+) PONV  Airway Mallampati: II  TM Distance: >3 FB Neck ROM: Full    Dental no notable dental hx.    Pulmonary neg pulmonary ROS, former smoker,    Pulmonary exam normal breath sounds clear to auscultation       Cardiovascular hypertension, Normal cardiovascular exam Rhythm:Regular Rate:Normal     Neuro/Psych negative neurological ROS  negative psych ROS   GI/Hepatic Neg liver ROS, GERD  ,  Endo/Other  negative endocrine ROS  Renal/GU negative Renal ROS  negative genitourinary   Musculoskeletal negative musculoskeletal ROS (+)   Abdominal   Peds negative pediatric ROS (+)  Hematology negative hematology ROS (+)   Anesthesia Other Findings   Reproductive/Obstetrics negative OB ROS                            Anesthesia Physical Anesthesia Plan  ASA: II  Anesthesia Plan: General   Post-op Pain Management:  Regional for Post-op pain   Induction: Intravenous  PONV Risk Score and Plan: 4 or greater and Ondansetron, Dexamethasone and Treatment may vary due to age or medical condition  Airway Management Planned: Oral ETT  Additional Equipment:   Intra-op Plan:   Post-operative Plan: Extubation in OR  Informed Consent: I have reviewed the patients History and Physical, chart, labs and discussed the procedure including the risks, benefits and alternatives for the proposed anesthesia with the patient or authorized representative who has indicated his/her understanding and acceptance.     Dental advisory given  Plan Discussed with: CRNA and Surgeon  Anesthesia Plan Comments: (See PAT note 09/30/19, Jodell Cipro, PA-C)       Anesthesia Quick  Evaluation

## 2020-09-30 NOTE — Progress Notes (Signed)
Anesthesia Chart Review   Case: [id number] Date/Time: 10/04/20 0845   Procedure: TOTAL SHOULDER ARTHROPLASTY (Left Shoulder)   Anesthesia type: Choice   Pre-op diagnosis: djd left shoulder   Location: [facility] ROOM 07 / [facility]   Surgeons: Teryl Lucy, MD      DISCUSSION:70 y.o. former smoker with h/o PONV, PVCs, left ventricular hypertrophy, djd left shoulder scheduled for above procedure 10/04/20 with Dr. Teryl Lucy.    Seen by cardiology 08/22/20 for preoperative evaluation.  Per OV note, "70 year old female referred for preoperative cardiovascular evaluation prior to left shoulder replacement surgery. The patient underwent recent right shoulder replacement surgery 06/21/2020 without complication. The patient is relatively asymptomatic, without chest pain or shortness of breath. She does experience occasional palpitations secondary to PVCs. She denies prior history of myocardial infarction, congestive heart failure, stroke, chronic kidney disease or diabetes. Recent CT of the upper left extremity showed incidental finding of coronary artery atherosclerosis. The patient should be at low, and acceptable risk for cardiovascular complication during left shoulder replacement surgery. The patient has essential hypertension, blood pressure well controlled on current BP medications. The patient is medically optimized."  Anticipate pt can proceed with planned procedure barring acute status change.    VS: BP 121/76   Pulse 69   Temp 36.7 C (Oral)   Resp 18   Ht 5\' 6"  (1.676 m)   Wt 80.7 kg   SpO2 100%   BMI 28.73 kg/m   PROVIDERS: , MD  Reather Converse, MD is Cardiologist  LABS: Labs reviewed: Acceptable for surgery. (all labs ordered are listed, but only abnormal results are displayed)  Labs Reviewed  SURGICAL PCR SCREEN - Abnormal; Notable for the following components:      Result Value   Staphylococcus aureus POSITIVE (*)    All other components within normal  limits  BASIC METABOLIC PANEL - Abnormal; Notable for the following components:   Glucose, Bld 102 (*)    Creatinine, Ser 1.01 (*)    GFR, Estimated 60 (*)    All other components within normal limits  CBC     IMAGES:   EKG: 02/10/20 Rate 95 bpm Sinus rhythm Abnormal R-wave progression, early transition Inferior infarct, old When comapred to prior, slower rate. No STEMI   CV: Echo 05/22/2019 Conclusions:  There is normal left ventricular systolic function The estimated ejection fraction is 60-65% Abnormal left ventricular diastolic function is observed The left atrium is mildly dilated The right ventricle is mildly dilated The right ventricular global systolic function is normal There is a trace of mitral regurgitation There is a trace tricuspid regurgitation No pulmonary hypertension is noted The inferior vena cava appears abnormal. The IVC is small c/w a low CVP  Past Medical History:  Diagnosis Date  . Arrhythmia   . Arthritis   . Complication of anesthesia   . Degenerative joint disease (DJD) of lumbar spine   . Dieulafoy's vascular malformation    acute GI bleed 02-10-20  . DJD (degenerative joint disease)   . Dysrhythmia    chronic PVC's  . GERD (gastroesophageal reflux disease)   . GI bleed   . HOCM (hypertrophic obstructive cardiomyopathy) (HCC)   . Hypertension   . Obesity   . PONV (postoperative nausea and vomiting)   . PVC's (premature ventricular contractions)     Past Surgical History:  Procedure Laterality Date  . ABDOMINAL HYSTERECTOMY    . BACK SURGERY    . BIOPSY  02/11/2020   Procedure: BIOPSY;  Surgeon: Rachael Fee, MD;  Location: [facility] ENDOSCOPY;  Service: Endoscopy;;  . CERVICAL DISCECTOMY    . CERVICAL FUSION    . CHOLECYSTECTOMY    . ESOPHAGOGASTRODUODENOSCOPY (EGD) WITH PROPOFOL N/A 02/11/2020   Procedure: ESOPHAGOGASTRODUODENOSCOPY (EGD) WITH PROPOFOL;  Surgeon: Rachael Fee, MD;  [facility];  Service: Endoscopy;   Laterality: N/A;  . LUMBAR DISC SURGERY    . MENISCECTOMY    . THORACIC DISCECTOMY    . THORACIC FUSION    . TOTAL SHOULDER ARTHROPLASTY Right 06/21/2020   Procedure: TOTAL SHOULDER ARTHROPLASTY;  Surgeon: Teryl Lucy, MD;  Location: [facility];  Service: Orthopedics;  Laterality: Right;    MEDICATIONS: . amLODipine (NORVASC) 5 MG tablet  . metoprolol tartrate (LOPRESSOR) 100 MG tablet  . naproxen (NAPROSYN) 500 MG tablet  . pantoprazole (PROTONIX) 20 MG tablet   No current facility-administered medications for this encounter.    Jodell Cipro, PA-C [facility] Pre-Surgical Testing [phone]

## 2020-09-30 NOTE — H&P (Signed)
SHOULDER ARTHROPLASTY ADMISSION H&P  Patient ID: Jennifer Baxter MRN: [id number] DOB/AGE: Dec 25, 1950 70 y.o.  Chief Complaint: left shoulder pain.  Planned Procedure Date: 10/04/20 Medical Clearance by Dr. Britt Bottom Cardiac Clearance by Dr. Darrold Junker   HPI: Jennifer Baxter is a 70 y.o. female who presents for evaluation of djd left shoulder. The patient has a history of pain and functional disability in the left shoulder due to arthritis and has failed non-surgical conservative treatments for greater than 12 weeks to include NSAID's and/or analgesics, corticosteriod injections and activity modification.  Onset of symptoms was gradual, starting 5 years ago with gradually worsening course since that time. The patient noted no past surgery on the left shoulder.  Patient currently rates pain at 8 out of 10 with activity. Patient has night pain, worsening of pain with activity and weight bearing and pain that interferes with activities of daily living.  Patient has evidence of joint space narrowing by imaging studies.  There is no active infection.  Past Medical History:  Diagnosis Date  . Arrhythmia   . Arthritis   . Complication of anesthesia   . Degenerative joint disease (DJD) of lumbar spine   . Dieulafoy's vascular malformation    acute GI bleed 02-10-20  . DJD (degenerative joint disease)   . Dysrhythmia    chronic PVC's  . GERD (gastroesophageal reflux disease)   . GI bleed   . HOCM (hypertrophic obstructive cardiomyopathy) (HCC)   . Hypertension   . Obesity   . PONV (postoperative nausea and vomiting)   . PVC's (premature ventricular contractions)    Past Surgical History:  Procedure Laterality Date  . ABDOMINAL HYSTERECTOMY    . BACK SURGERY    . BIOPSY  02/11/2020   Procedure: BIOPSY;  Surgeon: Rachael Fee, MD;  Location: [facility] ENDOSCOPY;  Service: Endoscopy;;  . CERVICAL DISCECTOMY    . CERVICAL FUSION    . CHOLECYSTECTOMY    . ESOPHAGOGASTRODUODENOSCOPY (EGD) WITH  PROPOFOL N/A 02/11/2020   Procedure: ESOPHAGOGASTRODUODENOSCOPY (EGD) WITH PROPOFOL;  Surgeon: Rachael Fee, MD;  Location: [facility] ENDOSCOPY;  Service: Endoscopy;  Laterality: N/A;  . LUMBAR DISC SURGERY    . MENISCECTOMY    . THORACIC DISCECTOMY    . THORACIC FUSION    . TOTAL SHOULDER ARTHROPLASTY Right 06/21/2020   Procedure: TOTAL SHOULDER ARTHROPLASTY;  Surgeon: Teryl Lucy, MD;  Location: [facility];  Service: Orthopedics;  Laterality: Right;   Allergies  Allergen Reactions  . Codeine Hives, Itching and Rash   Prior to Admission medications   Medication Sig Start Date End Date Taking? Authorizing Provider  amLODipine (NORVASC) 5 MG tablet Take 5 mg by mouth daily.   Yes [provider]  metoprolol tartrate (LOPRESSOR) 100 MG tablet Take 100 mg by mouth 2 (two) times daily.   Yes [provider]  naproxen (NAPROSYN) 500 MG tablet Take 500 mg by mouth 2 (two) times daily as needed for moderate pain.   Yes [provider]  pantoprazole (PROTONIX) 20 MG tablet Take 20 mg by mouth daily.   Yes [provider]   Social History   Socioeconomic History  . Marital status: Married    Spouse name: Not on file  . Number of children: Not on file  . Years of education: Not on file  . Highest education level: Not on file  Occupational History  . Not on file  Tobacco Use  . Smoking status: Former Games developer  . Smokeless tobacco: Never Used  .  Tobacco comment: Quit 30 years ago  Vaping Use  . Vaping Use: Never used  Substance and Sexual Activity  . Alcohol use: Yes    Comment: social  . Drug use: No  . Sexual activity: Not on file  Other Topics Concern  . Not on file  Social History Narrative  . Not on file   Social Determinants of Health   Financial Resource Strain: Not on file  Food Insecurity: Not on file  Transportation Needs: Not on file  Physical Activity: Not on file  Stress: Not on file  Social Connections: Not on file    Family History  Problem Relation Age of Onset  . Arthritis Mother   . Diabetes Mellitus I Mother   . Hypertension Mother   . Arthritis Father   . Hypertension Father   . Arthritis Sister   . Hypertension Sister   . Stroke Sister   . Alcoholism Brother   . Colon cancer Brother   . Hypertension Brother   . Diabetes Mellitus I Maternal Grandmother   . Heart disease Paternal Grandmother   . Hypertension Paternal Grandfather   . Stroke Paternal Grandfather   . Breast cancer Neg Hx     ROS: Currently denies lightheadedness, dizziness, Fever, chills, CP, SOB. No personal history of DVT, PE, MI, or CVA. No loose teeth. She does have dentures which she will remove prior to surgery. All other systems have been reviewed and were otherwise currently negative with the exception of those mentioned in the HPI and as above.  Objective: Vitals: Ht: 5'6'" Wt: 177.4 lbs Temp: 97.7 F BP: 123/79 Pulse: 71 O2 100% on room air.   Physical Exam: General: Alert, NAD.   HEENT: EOMI, Good Neck Extension  Pulm: No increased work of breathing.  Clear B/L A/P w/o crackle or wheeze.  CV: RRR, No m/g/r appreciated  GI: soft, NT, ND Neuro: Neuro without gross focal deficit.  Sensation intact distally Skin: No lesions in the area of chief complaint MSK/Surgical Site: left shoulder pain with range of motion.  Forward flexion/abduction approximately 0-130 degrees.  Internal rotation to T10.  External rotation to 0-30 degrees.  No AC pain.  No Biceps pain.  Rotator cuff intact to testing.  NVI distally.  Imaging Review Plain radiographs demonstrate severe degenerative joint disease of the left shoulder.    Assessment: djd left shoulder   Plan: Plan for Procedure(s): TOTAL SHOULDER ARTHROPLASTY  The patient history, physical exam, clinical judgement of the provider and imaging are consistent with end stage degenerative joint disease and left shoulder total joint arthroplasty is deemed medically  necessary. The treatment options including medical management, injection therapy, and arthroplasty were discussed at length. The risks and benefits of Procedure(s): TOTAL SHOULDER ARTHROPLASTY were presented and reviewed.  The risks of nonoperative treatment, versus surgical intervention including but not limited to continued pain, aseptic loosening, stiffness, dislocation/subluxation, infection, bleeding, nerve injury, blood clots, cardiopulmonary complications, morbidity, mortality, among others were discussed. The patient verbalizes understanding and wishes to proceed with the plan.  Patient is being admitted for surgery, OT, pain control, prophylactic antibiotics, VTE prophylaxis, progressive ambulation, ADL's and discharge planning.   Dental prophylaxis discussed and recommended for 2 years postoperatively.   The patient does meet the criteria for TXA which will be used perioperatively.  .  The patient is planning to be discharged home care of her husband.   Patient's anticipated LOS is less than 2 midnights, meeting these requirements: - Younger than 64 - Lives  within 1 hour of care - Has a competent adult at home to recover with post-op recover - NO history of  - Chronic pain requiring opiods  - Diabetes  - Coronary Artery Disease  - Heart failure  - Heart attack  - Stroke  - DVT/VTE  - Cardiac arrhythmia  - Respiratory Failure/COPD  - Renal failure  - Anemia  - Advanced Liver disease        Armida Sans, PA-C 09/30/2020 3:11 PM

## 2020-10-04 ENCOUNTER — Encounter (HOSPITAL_COMMUNITY)
Admission: RE | Disposition: A | Discharge status: Home / Self Care | Payer: Self-pay | Source: Home / Self Care | Attending: Orthopedic Surgery

## 2020-10-04 ENCOUNTER — Ambulatory Visit (HOSPITAL_COMMUNITY)
Admission: RE | Admit: 2020-10-04 | Discharge: 2020-10-04 | Disposition: A | Discharge status: Home / Self Care | Payer: Medicare Other | Attending: Orthopedic Surgery | Admitting: Orthopedic Surgery

## 2020-10-04 ENCOUNTER — Ambulatory Visit (HOSPITAL_COMMUNITY): Payer: Medicare Other | Admitting: Physician Assistant

## 2020-10-04 ENCOUNTER — Ambulatory Visit (HOSPITAL_COMMUNITY): Payer: Medicare Other

## 2020-10-04 ENCOUNTER — Encounter (HOSPITAL_COMMUNITY): Payer: Self-pay | Admitting: Orthopedic Surgery

## 2020-10-04 ENCOUNTER — Ambulatory Visit (HOSPITAL_COMMUNITY): Payer: Medicare Other | Admitting: Certified Registered Nurse Anesthetist

## 2020-10-04 DIAGNOSIS — Z79899 Other long term (current) drug therapy: Secondary | ICD-10-CM | POA: Insufficient documentation

## 2020-10-04 DIAGNOSIS — Z800 Family history of malignant neoplasm of digestive organs: Secondary | ICD-10-CM | POA: Diagnosis not present

## 2020-10-04 DIAGNOSIS — Z823 Family history of stroke: Secondary | ICD-10-CM | POA: Diagnosis not present

## 2020-10-04 DIAGNOSIS — M19012 Primary osteoarthritis, left shoulder: Secondary | ICD-10-CM | POA: Diagnosis not present

## 2020-10-04 DIAGNOSIS — Z833 Family history of diabetes mellitus: Secondary | ICD-10-CM | POA: Diagnosis not present

## 2020-10-04 DIAGNOSIS — Z6372 Alcoholism and drug addiction in family: Secondary | ICD-10-CM | POA: Diagnosis not present

## 2020-10-04 DIAGNOSIS — Z8261 Family history of arthritis: Secondary | ICD-10-CM | POA: Diagnosis not present

## 2020-10-04 DIAGNOSIS — Z96611 Presence of right artificial shoulder joint: Secondary | ICD-10-CM | POA: Diagnosis not present

## 2020-10-04 DIAGNOSIS — Z8249 Family history of ischemic heart disease and other diseases of the circulatory system: Secondary | ICD-10-CM | POA: Insufficient documentation

## 2020-10-04 DIAGNOSIS — Z885 Allergy status to narcotic agent status: Secondary | ICD-10-CM | POA: Diagnosis not present

## 2020-10-04 DIAGNOSIS — M25712 Osteophyte, left shoulder: Secondary | ICD-10-CM | POA: Insufficient documentation

## 2020-10-04 DIAGNOSIS — Z87891 Personal history of nicotine dependence: Secondary | ICD-10-CM | POA: Insufficient documentation

## 2020-10-04 DIAGNOSIS — Z96612 Presence of left artificial shoulder joint: Secondary | ICD-10-CM

## 2020-10-04 HISTORY — PX: TOTAL SHOULDER ARTHROPLASTY: SHX126

## 2020-10-04 SURGERY — ARTHROPLASTY, SHOULDER, TOTAL
Anesthesia: General | Site: Shoulder | Laterality: Left

## 2020-10-04 MED ORDER — ROCURONIUM BROMIDE 10 MG/ML (PF) SYRINGE
PREFILLED_SYRINGE | INTRAVENOUS | Status: AC
  Filled 2020-10-04: qty 10

## 2020-10-04 MED ORDER — ROCURONIUM BROMIDE 10 MG/ML (PF) SYRINGE
PREFILLED_SYRINGE | INTRAVENOUS | Status: DC | PRN
  Administered 2020-10-04: 60 mg via INTRAVENOUS

## 2020-10-04 MED ORDER — POVIDONE-IODINE 10 % EX SWAB
2.0000 "application " | Freq: Once | CUTANEOUS | Status: AC
  Administered 2020-10-04: 2 via TOPICAL

## 2020-10-04 MED ORDER — SODIUM CHLORIDE 0.9 % IR SOLN
Status: DC | PRN
  Administered 2020-10-04: 1000 mL

## 2020-10-04 MED ORDER — ORAL CARE MOUTH RINSE: 15.0000 mL | Freq: Once | OROMUCOSAL | Status: AC

## 2020-10-04 MED ORDER — LACTATED RINGERS IV BOLUS
500.0000 mL | Freq: Once | INTRAVENOUS | Status: AC
  Administered 2020-10-04: 500 mL via INTRAVENOUS

## 2020-10-04 MED ORDER — PROPOFOL 10 MG/ML IV BOLUS
INTRAVENOUS | Status: AC
  Filled 2020-10-04: qty 20

## 2020-10-04 MED ORDER — BUPIVACAINE LIPOSOME 1.3 % IJ SUSP
INTRAMUSCULAR | Status: DC | PRN
  Administered 2020-10-04: 10 mL via PERINEURAL

## 2020-10-04 MED ORDER — PROPOFOL 10 MG/ML IV BOLUS
INTRAVENOUS | Status: DC | PRN
  Administered 2020-10-04: 100 mg via INTRAVENOUS

## 2020-10-04 MED ORDER — FENTANYL CITRATE (PF) 100 MCG/2ML IJ SOLN
INTRAMUSCULAR | Status: DC | PRN
  Administered 2020-10-04: 50 ug via INTRAVENOUS

## 2020-10-04 MED ORDER — FENTANYL CITRATE (PF) 100 MCG/2ML IJ SOLN
50.0000 ug | INTRAMUSCULAR | Status: DC
  Administered 2020-10-04: 50 ug via INTRAVENOUS
  Filled 2020-10-04: qty 2

## 2020-10-04 MED ORDER — CEFAZOLIN SODIUM-DEXTROSE 2-4 GM/100ML-% IV SOLN: 2.0000 g | Freq: Four times a day (QID) | INTRAVENOUS | Status: DC

## 2020-10-04 MED ORDER — DEXAMETHASONE SODIUM PHOSPHATE 4 MG/ML IJ SOLN
INTRAMUSCULAR | Status: DC | PRN
  Administered 2020-10-04: 6 mg via INTRAVENOUS

## 2020-10-04 MED ORDER — PHENYLEPHRINE HCL (PRESSORS) 10 MG/ML IV SOLN
INTRAVENOUS | Status: AC
  Filled 2020-10-04: qty 1

## 2020-10-04 MED ORDER — PHENYLEPHRINE 40 MCG/ML (10ML) SYRINGE FOR IV PUSH (FOR BLOOD PRESSURE SUPPORT)
PREFILLED_SYRINGE | INTRAVENOUS | Status: DC | PRN
  Administered 2020-10-04: 120 ug via INTRAVENOUS
  Administered 2020-10-04: 80 ug via INTRAVENOUS

## 2020-10-04 MED ORDER — CHLORHEXIDINE GLUCONATE 0.12 % MT SOLN
15.0000 mL | Freq: Once | OROMUCOSAL | Status: AC
  Administered 2020-10-04: 15 mL via OROMUCOSAL

## 2020-10-04 MED ORDER — LIDOCAINE 2% (20 MG/ML) 5 ML SYRINGE
INTRAMUSCULAR | Status: DC | PRN
  Administered 2020-10-04: 60 mg via INTRAVENOUS

## 2020-10-04 MED ORDER — TRANEXAMIC ACID-NACL 1000-0.7 MG/100ML-% IV SOLN
1000.0000 mg | INTRAVENOUS | Status: AC
  Administered 2020-10-04: 1000 mg via INTRAVENOUS
  Filled 2020-10-04: qty 100

## 2020-10-04 MED ORDER — LIDOCAINE HCL (PF) 2 % IJ SOLN
INTRAMUSCULAR | Status: AC
  Filled 2020-10-04: qty 5

## 2020-10-04 MED ORDER — STERILE WATER FOR IRRIGATION IR SOLN
Status: DC | PRN
  Administered 2020-10-04: 2000 mL

## 2020-10-04 MED ORDER — FENTANYL CITRATE (PF) 100 MCG/2ML IJ SOLN: 25.0000 ug | INTRAMUSCULAR | Status: DC | PRN

## 2020-10-04 MED ORDER — ONDANSETRON HCL 4 MG/2ML IJ SOLN
INTRAMUSCULAR | Status: AC
  Filled 2020-10-04: qty 2

## 2020-10-04 MED ORDER — LACTATED RINGERS IV BOLUS
250.0000 mL | Freq: Once | INTRAVENOUS | Status: AC
  Administered 2020-10-04: 250 mL via INTRAVENOUS

## 2020-10-04 MED ORDER — ONDANSETRON HCL 4 MG/2ML IJ SOLN: 4.0000 mg | Freq: Once | INTRAMUSCULAR | Status: DC | PRN

## 2020-10-04 MED ORDER — TRANEXAMIC ACID-NACL 1000-0.7 MG/100ML-% IV SOLN: 1000.0000 mg | Freq: Once | INTRAVENOUS | Status: DC

## 2020-10-04 MED ORDER — PHENYLEPHRINE 40 MCG/ML (10ML) SYRINGE FOR IV PUSH (FOR BLOOD PRESSURE SUPPORT)
PREFILLED_SYRINGE | INTRAVENOUS | Status: AC
  Filled 2020-10-04: qty 10

## 2020-10-04 MED ORDER — MIDAZOLAM HCL 2 MG/2ML IJ SOLN
1.0000 mg | INTRAMUSCULAR | Status: DC
  Administered 2020-10-04: 1 mg via INTRAVENOUS
  Filled 2020-10-04: qty 2

## 2020-10-04 MED ORDER — PHENYLEPHRINE HCL-NACL 10-0.9 MG/250ML-% IV SOLN
INTRAVENOUS | Status: DC | PRN
  Administered 2020-10-04: 50 ug/min via INTRAVENOUS

## 2020-10-04 MED ORDER — ONDANSETRON HCL 4 MG/2ML IJ SOLN
INTRAMUSCULAR | Status: DC | PRN
  Administered 2020-10-04: 4 mg via INTRAVENOUS

## 2020-10-04 MED ORDER — BUPIVACAINE HCL (PF) 0.5 % IJ SOLN
INTRAMUSCULAR | Status: DC | PRN
  Administered 2020-10-04: 15 mL via PERINEURAL

## 2020-10-04 MED ORDER — AMISULPRIDE (ANTIEMETIC) 5 MG/2ML IV SOLN: 10.0000 mg | Freq: Once | INTRAVENOUS | Status: DC | PRN

## 2020-10-04 MED ORDER — 0.9 % SODIUM CHLORIDE (POUR BTL) OPTIME
TOPICAL | Status: DC | PRN
  Administered 2020-10-04: 1000 mL

## 2020-10-04 MED ORDER — ONDANSETRON HCL 4 MG PO TABS: 4.0000 mg | ORAL_TABLET | Freq: Three times a day (TID) | ORAL | 0 refills | Status: AC | PRN

## 2020-10-04 MED ORDER — FENTANYL CITRATE (PF) 100 MCG/2ML IJ SOLN
INTRAMUSCULAR | Status: AC
  Filled 2020-10-04: qty 2

## 2020-10-04 MED ORDER — SUGAMMADEX SODIUM 200 MG/2ML IV SOLN
INTRAVENOUS | Status: DC | PRN
  Administered 2020-10-04: 200 mg via INTRAVENOUS

## 2020-10-04 MED ORDER — LACTATED RINGERS IV SOLN: INTRAVENOUS | Status: DC

## 2020-10-04 MED ORDER — CEFAZOLIN SODIUM-DEXTROSE 2-4 GM/100ML-% IV SOLN
2.0000 g | INTRAVENOUS | Status: AC
  Administered 2020-10-04: 2 g via INTRAVENOUS
  Filled 2020-10-04: qty 100

## 2020-10-04 MED ORDER — ACETAMINOPHEN 500 MG PO TABS
1000.0000 mg | ORAL_TABLET | Freq: Once | ORAL | Status: AC
  Administered 2020-10-04: 1000 mg via ORAL
  Filled 2020-10-04: qty 2

## 2020-10-04 MED ORDER — HYDROMORPHONE HCL 1 MG/ML IJ SOLN: 0.2500 mg | INTRAMUSCULAR | Status: DC | PRN

## 2020-10-04 MED ORDER — DEXAMETHASONE SODIUM PHOSPHATE 10 MG/ML IJ SOLN
INTRAMUSCULAR | Status: AC
  Filled 2020-10-04: qty 1

## 2020-10-04 MED ORDER — HYDROCODONE-ACETAMINOPHEN 10-325 MG PO TABS
1.0000 | ORAL_TABLET | Freq: Four times a day (QID) | ORAL | 0 refills | Status: AC | PRN
Start: 2020-10-04 — End: ?

## 2020-10-04 SURGICAL SUPPLY — 59 items
BAG ZIPLOCK 12X15 (MISCELLANEOUS) ×2 IMPLANT
BIT DRILL QUICK REL 1/8 2PK SL (DRILL) ×1 IMPLANT
BLADE SAW SGTL 18X1.27X75 (BLADE) ×2 IMPLANT
CEMENT BONE R 1X40 (Cement) ×2 IMPLANT
CLSR STERI-STRIP ANTIMIC 1/2X4 (GAUZE/BANDAGES/DRESSINGS) ×2 IMPLANT
COVER BACK TABLE 60X90IN (DRAPES) ×2 IMPLANT
COVER MAYO STAND STRL (DRAPES) ×2 IMPLANT
COVER SURGICAL LIGHT HANDLE (MISCELLANEOUS) ×2 IMPLANT
COVER WAND RF STERILE (DRAPES) IMPLANT
DECANTER SPIKE VIAL GLASS SM (MISCELLANEOUS) IMPLANT
DRAPE POUCH INSTRU U-SHP 10X18 (DRAPES) ×2 IMPLANT
DRAPE SHEET LG 3/4 BI-LAMINATE (DRAPES) ×2 IMPLANT
DRAPE SURG 17X11 SM STRL (DRAPES) ×2 IMPLANT
DRAPE U-SHAPE 47X51 STRL (DRAPES) ×2 IMPLANT
DRILL QUICK RELEASE 1/8 INCH (DRILL) ×1
DRSG MEPILEX BORDER 4X8 (GAUZE/BANDAGES/DRESSINGS) ×2 IMPLANT
DURAPREP 26ML APPLICATOR (WOUND CARE) ×2 IMPLANT
ELECT REM PT RETURN 15FT ADLT (MISCELLANEOUS) ×2 IMPLANT
GLENOID MOD PE 4 PEG SZ 2 (Shoulder) ×2 IMPLANT
GLENOID MOD POST TM SZ2 (Post) ×2 IMPLANT
GLOVE BIO SURGEON STRL SZ7.5 (GLOVE) ×2 IMPLANT
GLOVE BIO SURGEON STRL SZ8 (GLOVE) ×2 IMPLANT
GLOVE SRG 8 PF TXTR STRL LF DI (GLOVE) ×2 IMPLANT
GLOVE SURG UNDER POLY LF SZ8 (GLOVE) ×2
GOWN STRL REUS W/TWL 2XL LVL3 (GOWN DISPOSABLE) ×2 IMPLANT
GOWN STRL REUS W/TWL LRG LVL3 (GOWN DISPOSABLE) ×2 IMPLANT
HANDPIECE INTERPULSE COAX TIP (DISPOSABLE) ×1
HEAD HUMERAL BIPOLAR 38X19X39 (Miscellaneous) ×1 IMPLANT
HEAD HUMERAL COMP STD (Orthopedic Implant) ×1 IMPLANT
HOOD PEEL AWAY FLYTE STAYCOOL (MISCELLANEOUS) ×6 IMPLANT
HUMERAL HEAD BIPOLAR 38X19X39 (Miscellaneous) ×2 IMPLANT
HUMERAL HEAD COMP STD (Orthopedic Implant) ×2 IMPLANT
KIT BASIN OR (CUSTOM PROCEDURE TRAY) ×2 IMPLANT
KIT TURNOVER KIT A (KITS) IMPLANT
NS IRRIG 1000ML POUR BTL (IV SOLUTION) ×2 IMPLANT
PACK SHOULDER (CUSTOM PROCEDURE TRAY) ×2 IMPLANT
PAD COLD SHLDR WRAP-ON (PAD) ×2 IMPLANT
PENCIL SMOKE EVACUATOR (MISCELLANEOUS) IMPLANT
PIN THREADED REVERSE (PIN) ×2 IMPLANT
PROTECTOR NERVE ULNAR (MISCELLANEOUS) ×2 IMPLANT
RESTRAINT HEAD UNIVERSAL NS (MISCELLANEOUS) IMPLANT
SET HNDPC FAN SPRY TIP SCT (DISPOSABLE) ×1 IMPLANT
SLING ARM IMMOBILIZER LRG (SOFTGOODS) ×2 IMPLANT
SMARTMIX MINI TOWER (MISCELLANEOUS)
STEM HUMERAL STRL 11MMX55MM (Stem) ×2 IMPLANT
SUCTION FRAZIER HANDLE 12FR (TUBING) ×1
SUCTION TUBE FRAZIER 12FR DISP (TUBING) ×1 IMPLANT
SUPPORT WRAP ARM LG (MISCELLANEOUS) ×2 IMPLANT
SUT FIBERWIRE #2 38 REV NDL BL (SUTURE) ×12
SUT FIBERWIRE #5 38 BLUE (WIRE) ×4 IMPLANT
SUT VIC AB 1 CT1 36 (SUTURE) ×2 IMPLANT
SUT VIC AB 2-0 CT1 27 (SUTURE) ×1
SUT VIC AB 2-0 CT1 TAPERPNT 27 (SUTURE) ×1 IMPLANT
SUT VIC AB 3-0 SH 8-18 (SUTURE) ×2 IMPLANT
SUTURE FIBERWR#2 38 REV NDL BL (SUTURE) ×6 IMPLANT
TOWEL OR 17X26 10 PK STRL BLUE (TOWEL DISPOSABLE) ×2 IMPLANT
TOWEL OR NON WOVEN STRL DISP B (DISPOSABLE) ×2 IMPLANT
TOWER SMARTMIX MINI (MISCELLANEOUS) IMPLANT
WATER STERILE IRR 1000ML POUR (IV SOLUTION) ×4 IMPLANT

## 2020-10-04 NOTE — Interval H&P Note (Signed)
History and Physical Interval Note:  10/04/2020 8:52 AM  Jennifer Baxter  has presented today for surgery, with the diagnosis of djd left shoulder.  The various methods of treatment have been discussed with the patient and family. After consideration of risks, benefits and other options for treatment, the patient has consented to  Procedure(s): TOTAL SHOULDER ARTHROPLASTY (Left) as a surgical intervention.  The patient's history has been reviewed, patient examined, no change in status, stable for surgery.  I have reviewed the patient's chart and labs.  Questions were answered to the patient's satisfaction.     Eulas Post

## 2020-10-04 NOTE — Anesthesia Procedure Notes (Signed)
Anesthesia Procedure Image    

## 2020-10-04 NOTE — Op Note (Signed)
10/04/2020  11:10 AM  PATIENT:  Jennifer Baxter    PRE-OPERATIVE DIAGNOSIS: Left shoulder primary localized osteoarthritis  POST-OPERATIVE DIAGNOSIS:  Same  PROCEDURE: LEFT total Shoulder Arthroplasty  SURGEON:  Eulas Post, MD  PHYSICIAN ASSISTANT: Janine Ores, PA-C, present and scrubbed throughout the case, critical for completion in a timely fashion, and for retraction, instrumentation, and closure.  ANESTHESIA:   General with an interscalene block  ESTIMATED BLOOD LOSS: 200 mL  UNIQUE ASPECTS OF THE CASE: She had very similar anatomy on the side that she did on the other side.  She had somewhat significant laxity of all of her structures, subscapularis was intact, supraspinatus had similar questionable appearance, although was still intact, there was a thick bursal layer that was I think not structurally significant, but there was no exposed joint.  I felt that the rotator cuff was functional, and intact.  She had extensive glenohumeral articular changes with osteophyte formation.  I had all 4 holes contained within the vault and an excellent fixation on the glenoid side.  I went 1 size smaller on the stem of the humerus, which allowed for the head to sit completely flush against the bone.  PREOPERATIVE INDICATIONS:  Jennifer Baxter is a  70 y.o. female who failed conservative measures and elected for surgical management.  She has had a previous contralateral total shoulder replacement, and elected for the same operation on the left side.  The risks benefits and alternatives were discussed with the patient preoperatively including but not limited to the risks of infection, bleeding, nerve injury, cardiopulmonary complications, the need for revision surgery, dislocation, loosening, incomplete relief of pain, among others, and the patient was willing to proceed.   OPERATIVE IMPLANTS: Biomet size 11 micro press-fit humeral stem, size 38+19 versa-dial humeral head, set in the E position  with increased coverage posteriorly, with a 2 cemented glenoid polyethylene 3 peg implant, alliance glenoid, with a central regenerex noncemented post.   OPERATIVE FINDINGS: Advanced glenohumeral osteoarthritis involving the glenoid and the humeral head with substantial osteophyte formation inferiorly.   OPERATIVE PROCEDURE: The patient was brought to the operating room and placed in the supine position. General anesthesia was administered. IV antibiotics were given.  The upper extremity was prepped and draped in usual sterile fashion. The patient was in a beachchair position with all bony prominences padded.   Time out was performed and a deltopectoral approach was carried out. The biceps tendon was tenodesed to the pectoralis tendon. The subscapularis was released, tagging it with a #2 FiberWire, leaving a cuff of tendon for repair.   The inferior osteophyte was removed, and release of the capsule off of the humeral side was completed. The head was dislocated, and I reamed sequentially. I placed the humeral cutting guide at 30 of retroversion, and then pinned this into place, and made my humeral neck cut. This was at the appropriate level.   I then placed deep retractors and exposed the glenoid. I excised the labrum circumferentially, taking care to protect the axillary nerve inferiorly.   I then placed a guidewire into the center position, controlling appropriate version and inclination. I then reamed over the guidewire with the small reamer, and was satisfied with the preparation. I preserved the subchondral bone in order to maximize the strength and minimize the risk for subsequent subsidence.   I then drilled the central hole for the regenerex peg, and then placed the guide, and then drilled the 3 peripheral peg holes. I had excellent bony circumferential  contact.   I then cleaned the glenoid, irrigated it copiously, and then dried it and cemented the prosthesis into place. Excellent seating  was achieved. I had full exposure. The cement cured while I turned my attention to the humeral side.   I sequentially broached, up to the selected size, with the broach set at 30 of retroversion. I placed 3 #2 Fiberwire through the bone for subsequent repair.  I then placed the real stem. I trialed with multiple heads, and the above-named component was selected. Increased posterior coverage improved the coverage. The soft tissue tension was appropriate.   I then impacted the real humeral head into place, reduced the head, and irrigated copiously. Excellent stability and range of motion was achieved. I repaired the subscapularis with a total of 5 #2 FiberWire; one for the interval, one for the corner, and then the remaining three from the lesser tuberosity which had already been passed.  Excellent repair achieved and I irrigated copiously once more. The subcutaneous tissue was closed with Vicryl including the deltopectoral fascia.   The skin was closed with Steri-Strips and sterile gauze was applied. She had a preoperative nerve block. She tolerated the procedure well and there were no complications.

## 2020-10-04 NOTE — Transfer of Care (Signed)
Immediate Anesthesia Transfer of Care Note  Patient: Jennifer Baxter  Procedure(s) Performed: TOTAL SHOULDER ARTHROPLASTY (Left Shoulder)  Patient Location: PACU  Anesthesia Type:General and Regional  Level of Consciousness: awake and alert   Airway & Oxygen Therapy: Patient Spontanous Breathing and Patient connected to face mask oxygen  Post-op Assessment: Report given to RN and Post -op Vital signs reviewed and stable  Post vital signs: Reviewed and stable  Last Vitals:  Vitals Value Taken Time  BP 112/69 10/04/20 1138  Temp    Pulse 71 10/04/20 1140  Resp 16 10/04/20 1140  SpO2 100 % 10/04/20 1140  Vitals shown include unvalidated device data.  Last Pain:  Vitals:   10/04/20 0845  TempSrc:   PainSc: 0-No pain      Patients Stated Pain Goal: 2 (10/04/20 0710)  Complications: No complications documented.

## 2020-10-04 NOTE — Evaluation (Signed)
Occupational Therapy Evaluation Patient Details Name: Jennifer Baxter MRN: [id number] DOB: 09/27/1950 Today's Date: 10/04/2020    History of Present Illness Patient is a 70 year old female admitted 1/11 L total shoulder arthroplasty. PMH includes R shoulder replacement, cervical and thoracic surgery, Dieulafoy's vascular malformation   Clinical Impression   s/p shoulder replacement without functional use of left non-dominant upper extremity secondary to effects of surgery and interscalene block and shoulder precautions. Therapist provided education and instruction to patient  in regards to exercises, precautions, positioning, donning upper extremity clothing and bathing while maintaining shoulder precautions, ice and edema management and donning/doffing sling. Patient verbalized understanding and demonstrated as needed. Patient needed assistance to donn shirt and provided with instruction on compensatory strategies to perform ADLs. Patient to follow up with MD for further therapy needs.      Follow Up Recommendations  Follow surgeon's recommendation for DC plan and follow-up therapies    Equipment Recommendations  None recommended by OT       Precautions / Restrictions Precautions Precautions: Shoulder Type of Shoulder Precautions: AROM elbow, wrist, hand ok. A/PROM shoulder NO Shoulder Interventions: Shoulder sling/immobilizer;Off for dressing/bathing/exercises Precaution Booklet Issued: Yes (comment) Required Braces or Orthoses: Sling Restrictions Weight Bearing Restrictions: Yes LUE Weight Bearing: Non weight bearing      Mobility Bed Mobility               General bed mobility comments: in chair    Transfers Overall transfer level: Independent                    Balance Overall balance assessment: Independent                                         ADL either performed or assessed with clinical judgement   ADL Overall ADL's : Needs  assistance/impaired Eating/Feeding: Independent   Grooming: Independent   Upper Body Bathing: Minimal assistance;Sitting   Lower Body Bathing: Independent   Upper Body Dressing : Minimal assistance;Sitting Upper Body Dressing Details (indicate cue type and reason): to pull shirt over L shoulder Lower Body Dressing: Independent   Toilet Transfer: Independent           Functional mobility during ADLs: Independent General ADL Comments: patient had R shoulder replacement in september and is familiar with precautions/protocol. required min A for pulling shirt over L shoulder as patient opted not to doff sling and thread L UE through sleeve.                  Pertinent Vitals/Pain Pain Assessment: Faces Faces Pain Scale: Hurts a little bit Pain Location: L shoulder Pain Descriptors / Indicators: Heaviness;Numbness Pain Intervention(s): Monitored during session     Hand Dominance Right   Extremity/Trunk Assessment Upper Extremity Assessment Upper Extremity Assessment: LUE deficits/detail LUE Deficits / Details: + nerve block LUE: Unable to fully assess due to immobilization   Lower Extremity Assessment Lower Extremity Assessment: Overall WFL for tasks assessed       Communication Communication Communication: No difficulties   Cognition Arousal/Alertness: Awake/alert Behavior During Therapy: WFL for tasks assessed/performed Overall Cognitive Status: Within Functional Limits for tasks assessed                                        Exercises  Exercises: Shoulder   Shoulder Instructions Shoulder Instructions Donning/doffing shirt without moving shoulder: Minimal assistance Method for sponge bathing under operated UE: Minimal assistance;Patient able to independently direct caregiver Donning/doffing sling/immobilizer: Patient able to independently direct caregiver Correct positioning of sling/immobilizer: Patient able to independently direct  caregiver Pendulum exercises (written home exercise program):  (N/A) ROM for elbow, wrist and digits of operated UE: Patient able to independently direct caregiver Sling wearing schedule (on at all times/off for ADL's): Patient able to independently direct caregiver Proper positioning of operated UE when showering: Patient able to independently direct caregiver Positioning of UE while sleeping: Patient able to independently direct caregiver    Home Living Family/patient expects to be discharged to:: Private residence Living Arrangements: Spouse/significant other;Other relatives Available Help at Discharge: Family Type of Home: House                                  Prior Functioning/Environment Level of Independence: Independent                 OT Problem List: Pain;Impaired UE functional use         OT Goals(Current goals can be found in the care plan section) Acute Rehab OT Goals Patient Stated Goal: home today OT Goal Formulation: All assessment and education complete, DC therapy   AM-PAC OT "6 Clicks" Daily Activity     Outcome Measure Help from another person eating meals?: None Help from another person taking care of personal grooming?: None Help from another person toileting, which includes using toliet, bedpan, or urinal?: None Help from another person bathing (including washing, rinsing, drying)?: A Little Help from another person to put on and taking off regular upper body clothing?: A Little Help from another person to put on and taking off regular lower body clothing?: None 6 Click Score: 22   End of Session Equipment Utilized During Treatment: Other (comment) (sling) Nurse Communication: Mobility status  Activity Tolerance: Patient tolerated treatment well Patient left: in chair;with call bell/phone within reach  OT Visit Diagnosis: Pain Pain - Right/Left: Left Pain - part of body: Shoulder                Time: 1245-1300 OT Time  Calculation (min): 15 min Charges:  OT General Charges $OT Visit: 1 Visit OT Evaluation $OT Eval Low Complexity: 1 Low  Marlyce Huge OT OT pager: [phone]  Carmelia Roller 10/04/2020, 1:05 PM

## 2020-10-04 NOTE — Anesthesia Procedure Notes (Signed)
Anesthesia Regional Block: Interscalene brachial plexus block   Pre-Anesthetic Checklist: ,, timeout performed, Correct Patient, Correct Site, Correct Laterality, Correct Procedure, Correct Position, site marked, Risks and benefits discussed,  Surgical consent,  Pre-op evaluation,  At surgeon's request and post-op pain management  Laterality: Left  Prep: chloraprep       Needles:  Injection technique: Single-shot  Needle Type: Echogenic Needle     Needle Length: 9cm      Additional Needles:   Procedures:,,,, ultrasound used (permanent image in chart),,,,  Narrative:  Start time: 10/04/2020 8:30 AM End time: 10/04/2020 8:38 AM Injection made incrementally with aspirations every 5 mL.  Performed by: Personally  Anesthesiologist: Eilene Ghazi, MD  Additional Notes: Patient tolerated the procedure well without complications

## 2020-10-04 NOTE — Anesthesia Postprocedure Evaluation (Signed)
Anesthesia Post Note  Patient: Jennifer Baxter  Procedure(s) Performed: TOTAL SHOULDER ARTHROPLASTY (Left Shoulder)     Patient location during evaluation: PACU Anesthesia Type: General Level of consciousness: awake and alert Pain management: pain level controlled Vital Signs Assessment: post-procedure vital signs reviewed and stable Respiratory status: spontaneous breathing, nonlabored ventilation, respiratory function stable and patient connected to nasal cannula oxygen Cardiovascular status: blood pressure returned to baseline and stable Postop Assessment: no apparent nausea or vomiting Anesthetic complications: no   No complications documented.  Last Vitals:  Vitals:   10/04/20 1230 10/04/20 1300  BP: 119/64 122/64  Pulse: 66 68  Resp: 13 14  Temp: (!) 36.4 C 36.6 C  SpO2: 96% 98%    Last Pain:  Vitals:   10/04/20 1300  TempSrc:   PainSc: 0-No pain                 Allsion Nogales S

## 2020-10-04 NOTE — Discharge Instructions (Signed)
Diet: As you were doing prior to hospitalization   Shower:  May shower but keep the wounds dry, use an occlusive plastic wrap, NO SOAKING IN TUB.  If the bandage gets wet, change with a clean dry gauze.  If you have a splint on, leave the splint in place and keep the splint dry with a plastic bag.  Dressing:  You may change your dressing 3-5 days after surgery, unless you have a splint.  If you have a splint, then just leave the splint in place and we will change your bandages during your first follow-up appointment.    If you had hand or foot surgery, we will plan to remove your stitches in about 2 weeks in the office.  For all other surgeries, there are sticky tapes (steri-strips) on your wounds and all the stitches are absorbable.  Leave the steri-strips in place when changing your dressings, they will peel off with time, usually 2-3 weeks.  Activity:  Increase activity slowly as tolerated, but follow the weight bearing instructions below.  The rules on driving is that you can not be taking narcotics while you drive, and you must feel in control of the vehicle.    Weight Bearing:   Non weight bearing with left arm, use shoulder sling  To prevent constipation: you may use a stool softener such as -  Colace (over the counter) 100 mg by mouth twice a day  Drink plenty of fluids (prune juice may be helpful) and high fiber foods Miralax (over the counter) for constipation as needed.    Itching:  If you experience itching with your medications, try taking only a single pain pill, or even half a pain pill at a time.  You may take up to 10 pain pills per day, and you can also use benadryl over the counter for itching or also to help with sleep.   Precautions:  If you experience chest pain or shortness of breath - call 911 immediately for transfer to the hospital emergency department!!  If you develop a fever greater that 101 F, purulent drainage from wound, increased redness or drainage from wound,  or calf pain -- Call the office at [phone]                                                Follow- Up Appointment:  Please call for an appointment to be seen in 2 weeks [place] - [phone]

## 2020-10-04 NOTE — Progress Notes (Signed)
Assisted  Dr. Okey Dupre with left, ultrasound guided, interscalene brachial plexus block. Side rails up, monitors on throughout procedure. See vital signs in flow sheet. Tolerated Procedure well.

## 2020-10-04 NOTE — Anesthesia Procedure Notes (Signed)
Procedure Name: Intubation Date/Time: 10/04/2020 9:16 AM Performed by: Caren Macadam, CRNA Pre-anesthesia Checklist: Patient identified, Emergency Drugs available, Suction available and Patient being monitored Patient Re-evaluated:Patient Re-evaluated prior to induction Oxygen Delivery Method: Circle system utilized Preoxygenation: Pre-oxygenation with 100% oxygen Induction Type: IV induction Ventilation: Mask ventilation without difficulty Laryngoscope Size: Miller and 2 Grade View: Grade I Tube type: Oral Tube size: 7.0 mm Number of attempts: 1 Airway Equipment and Method: Stylet and Oral airway Placement Confirmation: ETT inserted through vocal cords under direct vision,  positive ETCO2 and breath sounds checked- equal and bilateral Secured at: 22 cm Tube secured with: Tape Dental Injury: Teeth and Oropharynx as per pre-operative assessment

## 2020-10-05 ENCOUNTER — Encounter (HOSPITAL_COMMUNITY): Payer: Self-pay | Admitting: Orthopedic Surgery

## 2021-09-04 ENCOUNTER — Other Ambulatory Visit: Payer: Self-pay | Admitting: Internal Medicine

## 2021-09-04 DIAGNOSIS — Z1231 Encounter for screening mammogram for malignant neoplasm of breast: Secondary | ICD-10-CM

## 2021-10-06 ENCOUNTER — Ambulatory Visit
Admission: RE | Admit: 2021-10-06 | Discharge: 2021-10-06 | Disposition: A | Discharge status: Home / Self Care | Payer: Medicare Other | Source: Ambulatory Visit | Attending: Internal Medicine | Admitting: Internal Medicine

## 2021-10-06 DIAGNOSIS — Z1231 Encounter for screening mammogram for malignant neoplasm of breast: Secondary | ICD-10-CM

## 2021-12-04 ENCOUNTER — Ambulatory Visit: Payer: Medicare Other | Admitting: Internal Medicine

## 2022-01-05 IMAGING — CT CT SHOULDER*R* W/O CM
3 series · 14 of 33 positions shown, 17 images · non-contrast
Comparison: None.

CLINICAL DATA: Chronic right shoulder pain. Preoperative planning
study. No known injury.

EXAM:
CT OF THE UPPER RIGHT EXTREMITY WITHOUT CONTRAST
TECHNIQUE: Multidetector CT imaging of the upper right extremity was performed
according to the standard protocol.

[Series 2: shoulder 2.00 br40 s3 axial soft · axial · 0.48mm/px · z∈[-863,-723]mm · 6 of 92 slices shown, 8 images]
[im 15/92  soft-tissue]
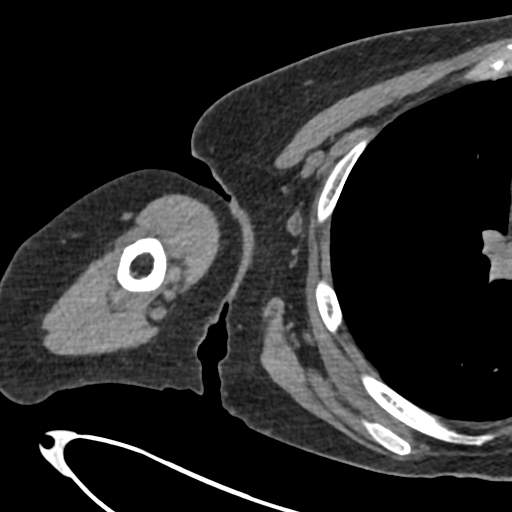
[im 15/92  bone]
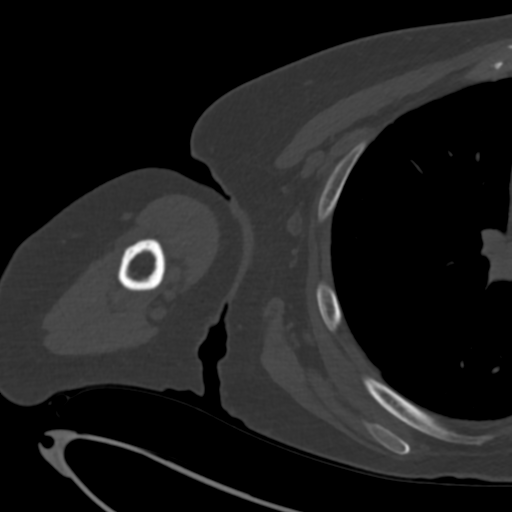
[im 29/92  bone]
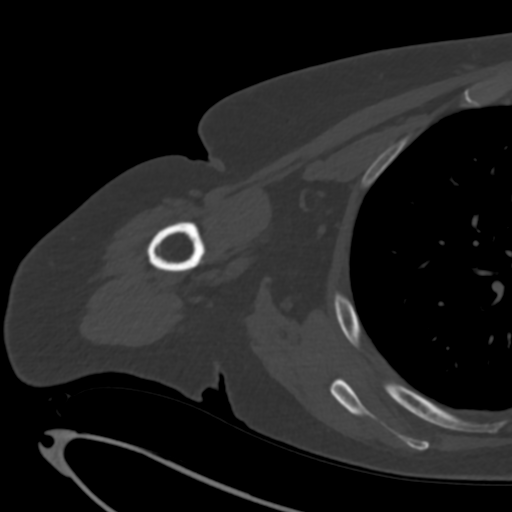
[im 43/92  bone]
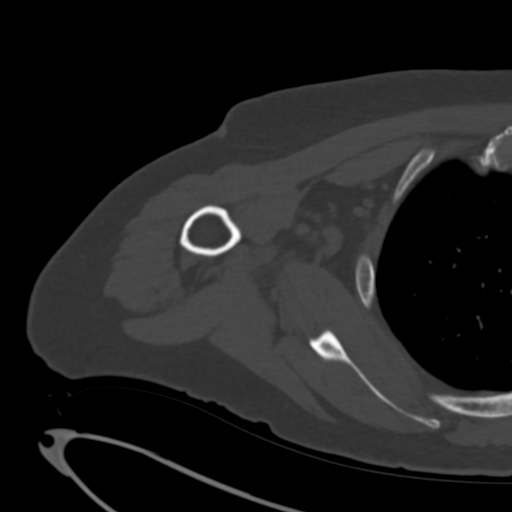
[im 57/92  bone]
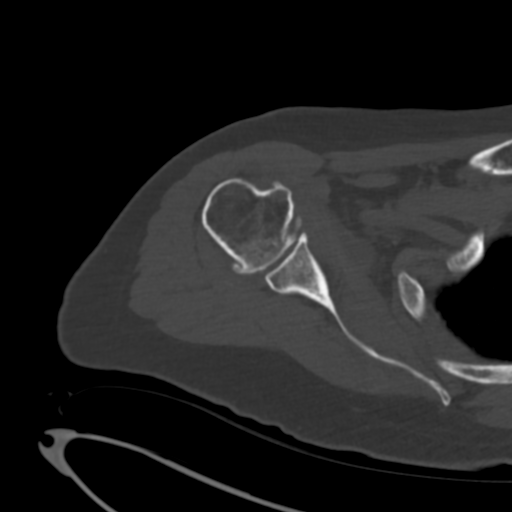
[im 71/92  soft-tissue]
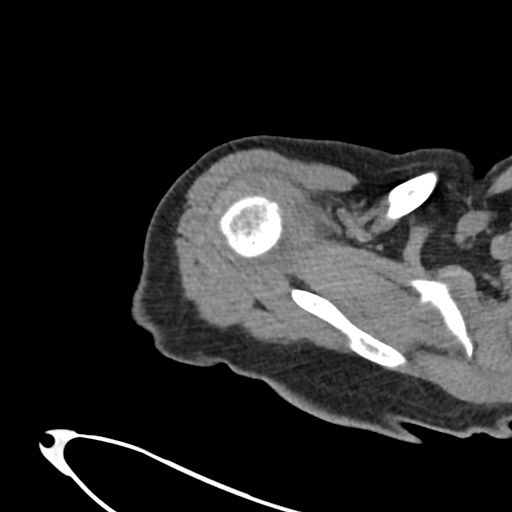
[im 71/92  bone]
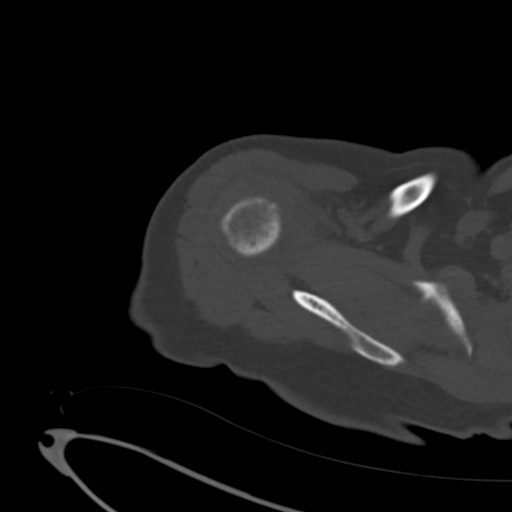
[im 85/92  bone]
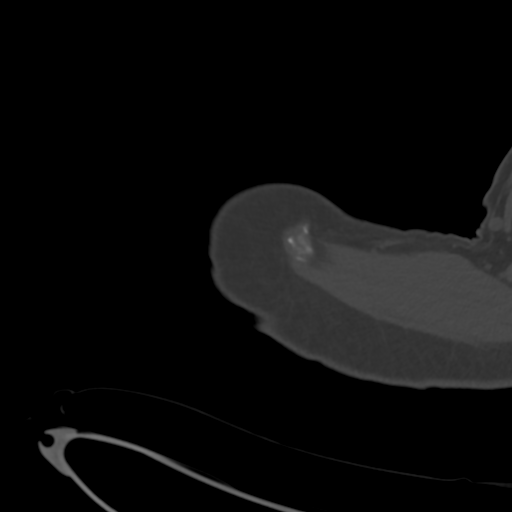

[Series 6: shoulder 2.00 br40 s3 cor soft · coronal · 0.36mm/px · 3 of 125 slices shown]
[im 39/125  bone]
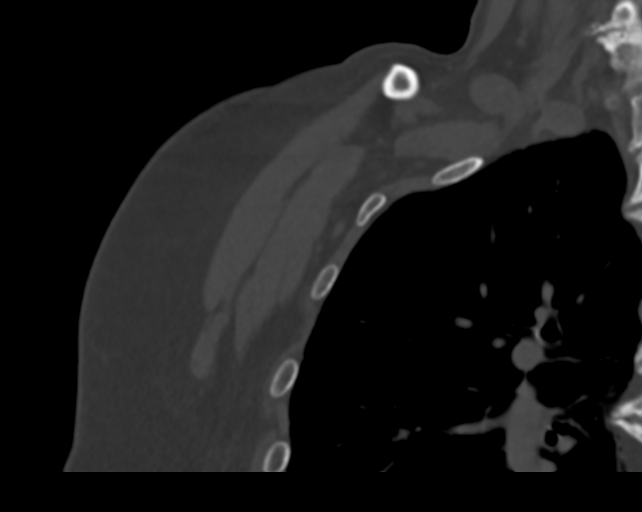
[im 55/125  bone]
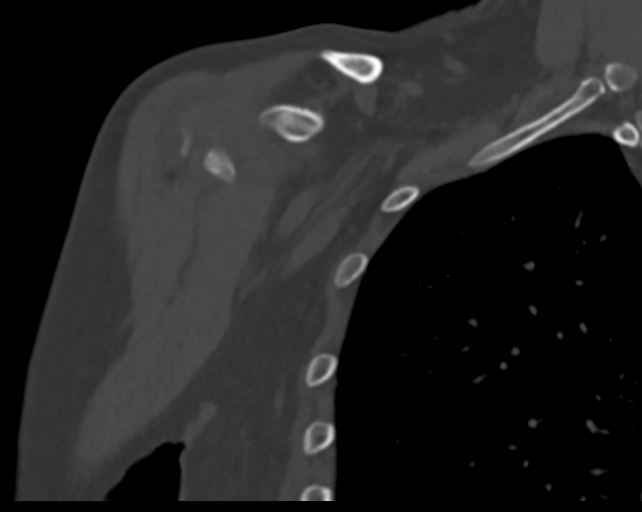
[im 70/125  bone]
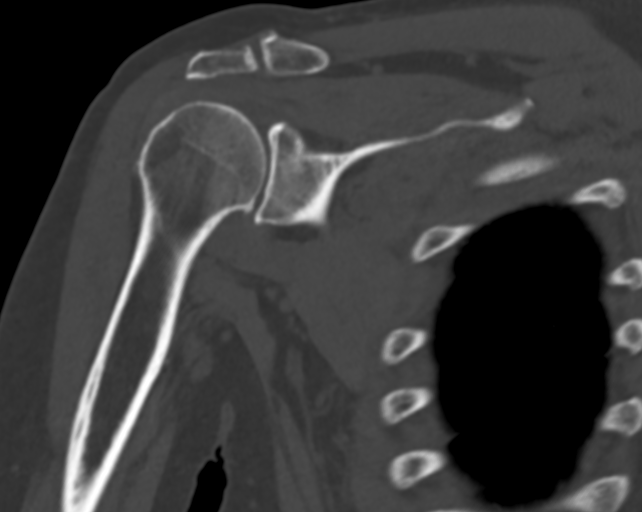

[Series 10: shoulder 2.00 br40 s3 sag soft · sagittal · 0.36mm/px · 5 of 125 slices shown, 6 images]
[im 42/125  bone]
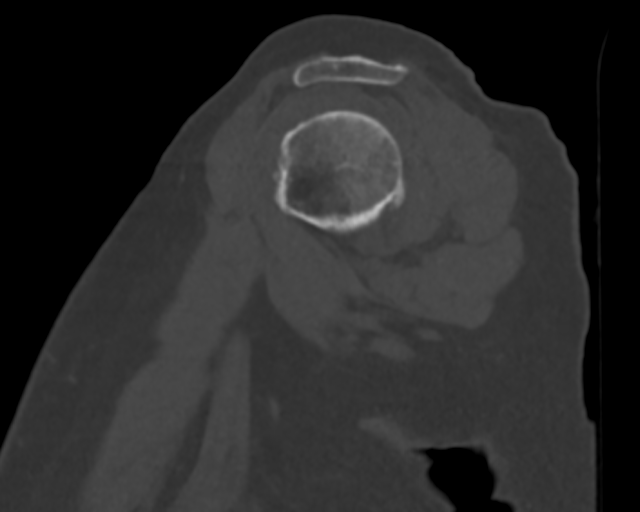
[im 52/125  bone]
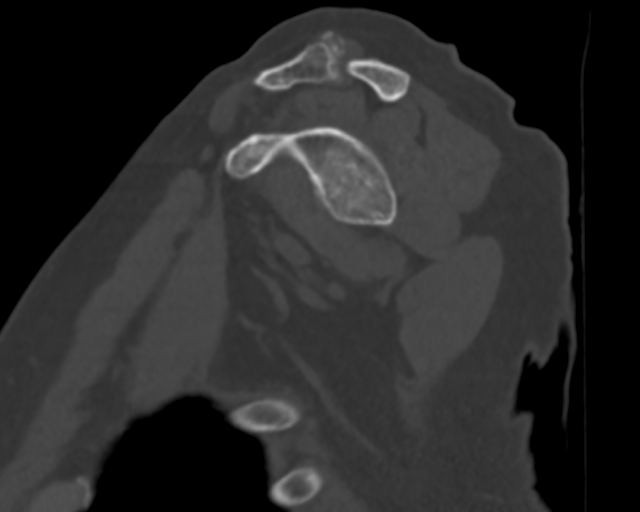
[im 63/125  soft-tissue]
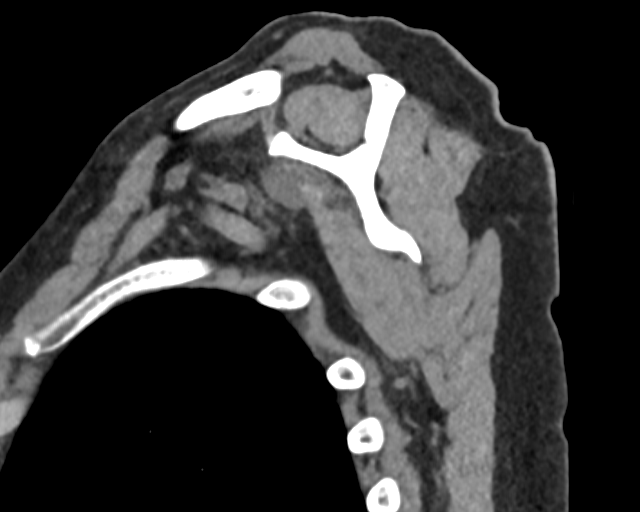
[im 63/125  bone]
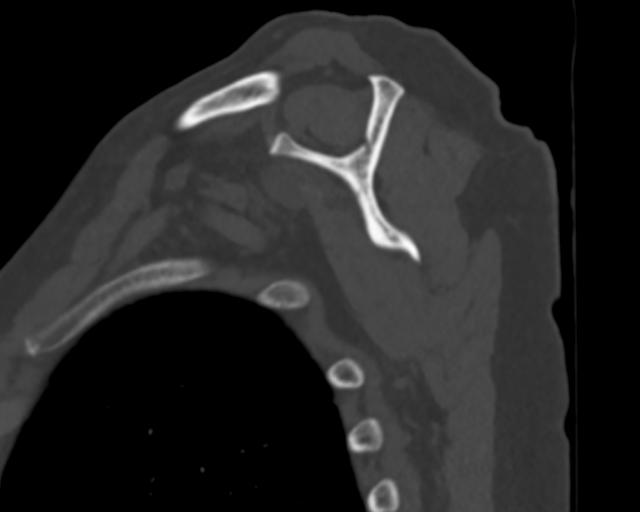
[im 73/125  bone]
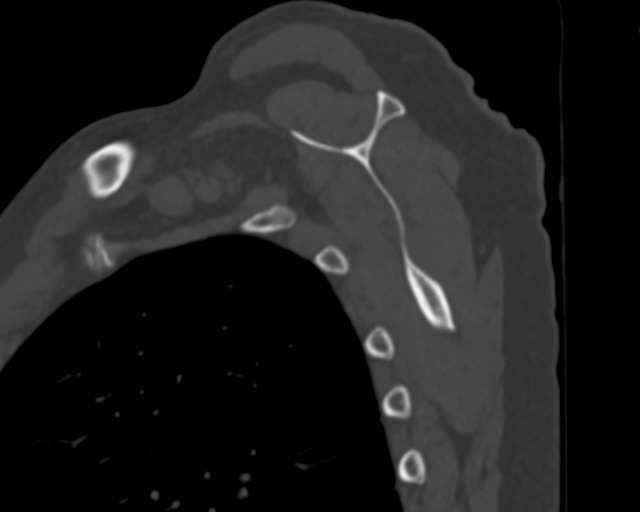
[im 83/125  bone]
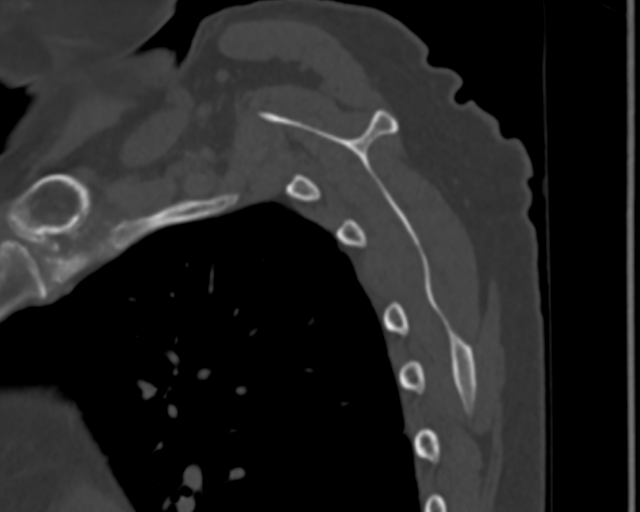

[14 of 33 positions shown; findings below may reference images not displayed]

FINDINGS: Bones/Joint/Cartilage

There is no acute or focal abnormality. The patient has glenohumeral
osteoarthritis with joint space narrowing, osteophytosis about the
humeral head. Mild osteophytosis about the glenoid also noted. No
subchondral cyst formation about the joint. No loose body is seen in
the joint. A loose body in the bicipital groove measuring 0.6 cm is
identified.

The patient also has moderately severe acromioclavicular
osteoarthritis. The acromion is type 2 with a small subacromial
spur. No lytic or sclerotic lesion.

Ligaments

Suboptimally assessed by CT.

Muscles and Tendons

Musculature of the shoulder girdle is preserved. The rotator cuff
appears intact by CT.

Soft tissues

Negative.
IMPRESSION: Glenohumeral osteoarthritis. 0.6 cm loose body in the bicipital
groove noted.

Acromioclavicular osteoarthritis with a small subacromial spur.

## 2022-06-01 ENCOUNTER — Ambulatory Visit (INDEPENDENT_AMBULATORY_CARE_PROVIDER_SITE_OTHER): Payer: Medicare Other

## 2022-06-01 ENCOUNTER — Ambulatory Visit: Payer: Medicare Other | Attending: Cardiology | Admitting: Cardiology

## 2022-06-01 ENCOUNTER — Encounter: Payer: Self-pay | Admitting: Cardiology

## 2022-06-01 VITALS — BP 132/88 | HR 72 | Ht 66.0 in | Wt 232.2 lb

## 2022-06-01 DIAGNOSIS — R002 Palpitations: Secondary | ICD-10-CM | POA: Insufficient documentation

## 2022-06-01 NOTE — Patient Instructions (Signed)
Medication Instructions:  Your physician recommends that you continue on your current medications as directed. Please refer to the Current Medication list given to you today.  *If you need a refill on your cardiac medications before your next appointment, please call your pharmacy*   Lab Work: None ordered If you have labs (blood work) drawn today and your tests are completely normal, you will receive your results only by: MyChart Message (if you have MyChart) OR A paper copy in the mail If you have any lab test that is abnormal or we need to change your treatment, we will call you to review the results.   Testing/Procedures:                           ZIO XT- Long Term Monitor Instructions  Your physician has requested you wear a ZIO patch monitor for 14 days.  This is a single patch monitor. [employer] supplies one patch monitor per enrollment. Additional stickers are not available. Please do not apply patch if you will be having a Nuclear Stress Test,  Echocardiogram, Cardiac CT, MRI, or Chest Xray during the period you would be wearing the  monitor. The patch cannot be worn during these tests. You cannot remove and re-apply the  ZIO XT patch monitor.  Your ZIO patch monitor will be mailed 3 day USPS to your address on file. It may take 3-5 days  to receive your monitor after you have been enrolled.  Once you have received your monitor, please review the enclosed instructions. Your monitor  has already been registered assigning a specific monitor serial # to you.  Billing and Patient Assistance Program Information  We have supplied [employer] with any of your insurance information on file for billing purposes. [employer] offers a sliding scale Patient Assistance Program for patients that do not have  insurance, or whose insurance does not completely cover the cost of the ZIO monitor.  You must apply for the Patient Assistance Program to qualify for this discounted rate.  To apply, please  call [employer] at [phone], select option 4, select option 2, ask to apply for  Patient Assistance Program. [employer] will ask your household income, and how many people  are in your household. They will quote your out-of-pocket cost based on that information.  [employer] will also be able to set up a 12-month, interest-free payment plan if needed.  Applying the monitor   Shave hair from upper left chest.  Hold abrader disc by orange tab. Rub abrader in 40 strokes over the upper left chest as  indicated in your monitor instructions.  Clean area with 4 enclosed alcohol pads. Let dry.  Apply patch as indicated in monitor instructions. Patch will be placed under collarbone on left  side of chest with arrow pointing upward.  Rub patch adhesive wings for 2 minutes. Remove white label marked "1". Remove the white  label marked "2". Rub patch adhesive wings for 2 additional minutes.  While looking in a mirror, press and release button in center of patch. A small green light will  flash 3-4 times. This will be your only indicator that the monitor has been turned on.  Do not shower for the first 24 hours. You may shower after the first 24 hours.  Press the button if you feel a symptom. You will hear a small click. Record Date, Time and  Symptom in the Patient Logbook.  When you are ready to remove the   patch, follow instructions on the last 2 pages of Patient  Logbook. Stick patch monitor onto the last page of Patient Logbook.  Place Patient Logbook in the blue and white box. Use locking tab on box and tape box closed  securely. The blue and white box has prepaid postage on it. Please place it in the mailbox as  soon as possible. Your physician should have your test results approximately 7 days after the  monitor has been mailed back to [employer].  Call [employer] Customer Care at [phone] if you have questions regarding  your ZIO XT patch monitor. Call them immediately if you see an  orange light blinking on your  monitor.  If your monitor falls off in less than 4 days, contact our Monitor department at [phone].  If your monitor becomes loose or falls off after 4 days call [employer] at [phone] for  suggestions on securing your monitor   Follow-Up: At [facility], you and your health needs are our priority.  As part of our continuing mission to provide you with exceptional heart care, we have created designated Provider Care Teams.  These Care Teams include your primary Cardiologist (physician) and Advanced Practice Providers (APPs -  Physician Assistants and Nurse Practitioners) who all work together to provide you with the care you need, when you need it.  Your next appointment:   To be  determined  The format for your next appointment:   In Person  Provider:   Will Camnitz, MD    Thank you for choosing [facility]!!   Baron Parmelee, RN [phone]  Other Instructions   Important Information About Sugar           

## 2022-06-01 NOTE — Progress Notes (Unsigned)
14 day zio mailed to pt.

## 2022-06-01 NOTE — Progress Notes (Signed)
Electrophysiology Office Note   Date:  06/01/2022   ID:  Jennifer Baxter, DOB [birth year], MRN [id number]  PCP:  Reather Converse, MD  Cardiologist:   Primary Electrophysiologist:  Jerrik Housholder Jorja Loa, MD    Chief Complaint: palpitations   History of Present Illness: Jennifer Baxter is a 71 y.o. female who is being seen today for the evaluation of PVCs at the request of Reather Converse, MD. Presenting today for electrophysiology evaluation.  She has a history significant hypertension and PVCs.  She is currently on bisoprolol.  She was seen in [place] and was taken off of amlodipine and metoprolol and started on verapamil.  She presented to emergency room a few days later with profound episodes of tachycardia and frequent PVCs.  She had nausea and shortness of breath but no chest pain.  He has a history of LVH and exercise induced tachycardia.  Her tachycardia was initially attributed to anemia after she was found to have a GI bleed.  Despite that, she continued to have episodes of tachycardia.  She was switched back to Toprol-XL and amlodipine.    Today, denies symptoms of chest pain, shortness of breath, orthopnea, PND, lower extremity edema, claudication, dizziness, presyncope, syncope, bleeding, or neurologic sequela. The patient is tolerating medications without difficulties.  Presents today with palpitations.  She states that when she is exerting herself that she can notice that her heart rate goes quite fast.  She does not have pain with this, but does note quite a few symptoms when her heart rate is fast.  She also states that her blood pressure has been up and down, into the 150s to 160s and down into the low 100s.  She has been taking higher doses of amlodipine, but she does not feel like this is helping.   Past Medical History:  Diagnosis Date   Arrhythmia    Arthritis    Complication of anesthesia    Degenerative joint disease (DJD) of lumbar spine    Dieulafoy's vascular  malformation    acute GI bleed 02-10-20   DJD (degenerative joint disease)    Dysrhythmia    chronic PVC's   GERD (gastroesophageal reflux disease)    GI bleed    HOCM (hypertrophic obstructive cardiomyopathy) (HCC)    Hypertension    Obesity    PONV (postoperative nausea and vomiting)    PVC's (premature ventricular contractions)    Past Surgical History:  Procedure Laterality Date   ABDOMINAL HYSTERECTOMY     BACK SURGERY     BIOPSY  02/11/2020   Procedure: BIOPSY;  Surgeon: Rachael Fee, MD;  Location: [facility] ENDOSCOPY;  Service: Endoscopy;;   CERVICAL DISCECTOMY     CERVICAL FUSION     CHOLECYSTECTOMY     ESOPHAGOGASTRODUODENOSCOPY (EGD) WITH PROPOFOL N/A 02/11/2020   Procedure: ESOPHAGOGASTRODUODENOSCOPY (EGD) WITH PROPOFOL;  Surgeon: Rachael Fee, MD;  Location: [facility] ENDOSCOPY;  Service: Endoscopy;  Laterality: N/A;   LUMBAR DISC SURGERY     MENISCECTOMY     THORACIC DISCECTOMY     THORACIC FUSION     TOTAL SHOULDER ARTHROPLASTY Right 06/21/2020   Procedure: TOTAL SHOULDER ARTHROPLASTY;  Surgeon: Teryl Lucy, MD;  Location: [facility];  Service: Orthopedics;  Laterality: Right;   TOTAL SHOULDER ARTHROPLASTY Left 10/04/2020   Procedure: TOTAL SHOULDER ARTHROPLASTY;  Surgeon: Teryl Lucy, MD;  Location: [facility] ORS;  Service: Orthopedics;  Laterality: Left;     Current Outpatient Medications  Medication Sig Dispense Refill   amLODipine (NORVASC) 5  MG tablet Take 5 mg by mouth daily.     metoprolol tartrate (LOPRESSOR) 100 MG tablet Take 100 mg by mouth 2 (two) times daily.     tiZANidine (ZANAFLEX) 4 MG tablet Take by mouth. TAKE 1/2 TABLETS EVERY NIGHT AT BEDTIME FOR 1 WEEK THEN 1 EVERY NIGHT AT BEDTIME FOR 1 WEEK THEN 1 TWICE DAILY AS NEEDED     diphenhydrAMINE (BENADRYL) 25 MG tablet Take 25 mg by mouth every 6 (six) hours as needed. (Patient not taking: Reported on 06/01/2022)     HYDROcodone-acetaminophen (NORCO) 10-325 MG tablet Take 1 tablet by mouth  every 6 (six) hours as needed. (Patient not taking: Reported on 06/01/2022) 20 tablet 0   ondansetron (ZOFRAN) 4 MG tablet Take 1 tablet (4 mg total) by mouth every 8 (eight) hours as needed for nausea or vomiting. (Patient not taking: Reported on 06/01/2022) 10 tablet 0   pantoprazole (PROTONIX) 40 MG tablet Take 40 mg by mouth daily. (Patient not taking: Reported on 06/01/2022)     triamterene-hydrochlorothiazide (DYAZIDE) 37.5-25 MG capsule Take 1 capsule by mouth daily. (Patient not taking: Reported on 06/01/2022)     No current facility-administered medications for this visit.    Allergies:   Codeine and Cortisone   Social History:  The patient  reports that she has quit smoking. She has never used smokeless tobacco. She reports current alcohol use. She reports that she does not use drugs.   Family History:  The patient's family history includes Alcoholism in her brother; Arthritis in her father, mother, and sister; Colon cancer in her brother; Diabetes Mellitus I in her maternal grandmother and mother; Heart disease in her paternal grandmother; Hypertension in her brother, father, mother, paternal grandfather, and sister; Stroke in her paternal grandfather and sister.   ROS:  Please see the history of present illness.   Otherwise, review of systems is positive for none.   All other systems are reviewed and negative.   PHYSICAL EXAM: VS:  BP 132/88   Pulse 72   Ht 5\' 6"  (1.676 m)   Wt 232 lb 3.2 oz (105.3 kg)   SpO2 98%   BMI 37.48 kg/m  , BMI Body mass index is 37.48 kg/m. GEN: Well nourished, well developed, in no acute distress  HEENT: normal  Neck: no JVD, carotid bruits, or masses Cardiac: RRR; no murmurs, rubs, or gallops,no edema  Respiratory:  clear to auscultation bilaterally, normal work of breathing GI: soft, nontender, nondistended, + BS MS: no deformity or atrophy  Skin: warm and dry Neuro:  Strength and sensation are intact Psych: euthymic mood, full affect  EKG:  EKG  is ordered today. Personal review of the ekg ordered shows sinus rhythm  Recent Labs: No results found for requested labs within last 365 days.    Lipid Panel  No results found for: "CHOL", "TRIG", "HDL", "CHOLHDL", "VLDL", "LDLCALC", "LDLDIRECT"   Wt Readings from Last 3 Encounters:  06/01/22 232 lb 3.2 oz (105.3 kg)  09/29/20 178 lb (80.7 kg)  06/21/20 169 lb 15.6 oz (77.1 kg)      Other studies Reviewed: Additional studies/ records that were reviewed today include: CMRI 2015 Cardiac MRI performed with and without contrast on a 3.0 T MRI system to evaluate myocardial morphology, function, and viability in a patient with concerns for infiltrative cardiomyopathy vs asymmetric septal hypertrophy  1.  The left ventricle is normal in size and wall thickness.  Although the septum is mildly sigmoid shaped, wall thickness measures 1.1  cm.  Regional and global systolic function are normal.  The LVEF is calculated at >60%.  2.  The right ventricle is normal in size and systolic function.    3.  The atria are normal in size.  4.  The mitral valve leaflets are thickened with normal opening.  There is no significant mitral stenosis or regurgitation.  There is mild tricuspid regurgitation.  5.  The aortic valve is tricuspid with normal opening.There is mild leaflet thickening without significant aortic stenosis or regurgitation.    6.  Delayed enhancement imaging for viability is normal.  There is no evidence of myocardial infarction, scarring, or infiltration.  ASSESSMENT AND PLAN:  1.  PVCs: Had recordings that show AIVR.  Currently on metoprolol 100 mg twice daily.  No PVCs today.  She does feel palpitations.  It is unclear to me as to the cause of her palpitations.  Due to that, we Destiny Trickey have her wear a 2-week monitor.  She is on metoprolol as above.  It may be that switching her to Toprol-XL twice daily Imelda Dandridge help with her symptoms.  2.  Hypertension: Blood pressure appears well  controlled.  I have told her that if it is less than 130/80 on average that there are no changes that need to be made.  She understands this.  Current medicines are reviewed at length with the patient today.   The patient does not have concerns regarding her medicines.  The following changes were made today:  none  Labs/ tests ordered today include:  Orders Placed This Encounter  Procedures   LONG TERM MONITOR (3-14 DAYS)   EKG 12-Lead     Disposition:   FU with Lennyn Bellanca pending monitor results months  Signed, Matai Carpenito Jorja Loa, MD  06/01/2022 10:18 AM     [facility] [address] [place] [address] [phone] (office) [phone] (fax)

## 2022-06-05 DIAGNOSIS — R002 Palpitations: Secondary | ICD-10-CM | POA: Diagnosis not present

## 2023-12-23 ENCOUNTER — Other Ambulatory Visit: Payer: Self-pay | Admitting: Internal Medicine

## 2023-12-23 DIAGNOSIS — Z1231 Encounter for screening mammogram for malignant neoplasm of breast: Secondary | ICD-10-CM

## 2023-12-31 ENCOUNTER — Ambulatory Visit
Admission: RE | Admit: 2023-12-31 | Discharge: 2023-12-31 | Disposition: A | Discharge status: Home / Self Care | Source: Ambulatory Visit | Attending: Internal Medicine | Admitting: Internal Medicine

## 2023-12-31 DIAGNOSIS — Z1231 Encounter for screening mammogram for malignant neoplasm of breast: Secondary | ICD-10-CM

## 2024-07-07 ENCOUNTER — Other Ambulatory Visit (HOSPITAL_COMMUNITY): Payer: Self-pay | Admitting: Physician Assistant

## 2024-07-07 ENCOUNTER — Ambulatory Visit (HOSPITAL_COMMUNITY)
Admission: RE | Admit: 2024-07-07 | Discharge: 2024-07-07 | Disposition: A | Discharge status: Home / Self Care | Source: Ambulatory Visit | Attending: Vascular Surgery | Admitting: Vascular Surgery

## 2024-07-07 DIAGNOSIS — R52 Pain, unspecified: Secondary | ICD-10-CM

## 2024-11-19 ENCOUNTER — Ambulatory Visit: Admitting: Neurology
